# Patient Record
Sex: Female | Born: 1995 | Race: White | Hispanic: No | Marital: Single | Smoking: Never smoker
Health system: Southern US, Community
[De-identification: ages and names within clinical notes are randomized; demographics above are authoritative.]

## PROBLEM LIST (undated history)

## (undated) DIAGNOSIS — J45909 Unspecified asthma, uncomplicated: Secondary | ICD-10-CM

## (undated) DIAGNOSIS — L509 Urticaria, unspecified: Secondary | ICD-10-CM

## (undated) DIAGNOSIS — G935 Compression of brain: Secondary | ICD-10-CM

## (undated) HISTORY — DX: Urticaria, unspecified: L50.9

## (undated) HISTORY — PX: NO PAST SURGERIES: SHX2092

---

## 2019-05-06 ENCOUNTER — Other Ambulatory Visit: Payer: Self-pay

## 2019-05-06 ENCOUNTER — Ambulatory Visit (HOSPITAL_COMMUNITY)
Admission: EM | Admit: 2019-05-06 | Discharge: 2019-05-06 | Disposition: A | Discharge status: Home / Self Care | Payer: Self-pay | Attending: Family Medicine | Admitting: Family Medicine

## 2019-05-06 ENCOUNTER — Telehealth (HOSPITAL_COMMUNITY): Payer: Self-pay | Admitting: Emergency Medicine

## 2019-05-06 ENCOUNTER — Encounter (HOSPITAL_COMMUNITY): Payer: Self-pay | Admitting: Family Medicine

## 2019-05-06 DIAGNOSIS — R531 Weakness: Secondary | ICD-10-CM | POA: Insufficient documentation

## 2019-05-06 DIAGNOSIS — R5383 Other fatigue: Secondary | ICD-10-CM

## 2019-05-06 DIAGNOSIS — M791 Myalgia, unspecified site: Secondary | ICD-10-CM | POA: Insufficient documentation

## 2019-05-06 LAB — COMPREHENSIVE METABOLIC PANEL
ALT: 23 U/L (ref 0–44)
AST: 31 U/L (ref 15–41)
Albumin: 4.1 g/dL (ref 3.5–5.0)
Alkaline Phosphatase: 60 U/L (ref 38–126)
Anion gap: 10 (ref 5–15)
BUN: 12 mg/dL (ref 6–20)
CO2: 27 mmol/L (ref 22–32)
Calcium: 9.7 mg/dL (ref 8.9–10.3)
Chloride: 104 mmol/L (ref 98–111)
Creatinine, Ser: 0.74 mg/dL (ref 0.44–1.00)
GFR calc Af Amer: >60 mL/min (ref >60)
GFR calc non Af Amer: >60 mL/min (ref >60)
Glucose, Bld: 83 mg/dL (ref 70–99)
Potassium: 3.7 mmol/L (ref 3.5–5.1)
Sodium: 141 mmol/L (ref 135–145)
Total Bilirubin: 0.3 mg/dL (ref 0.3–1.2)
Total Protein: 6.9 g/dL (ref 6.5–8.1)

## 2019-05-06 LAB — CBC WITH DIFFERENTIAL/PLATELET
Abs Immature Granulocytes: 0.01 10*3/uL (ref 0.00–0.07)
Basophils Absolute: 0 10*3/uL (ref 0.0–0.1)
Basophils Relative: 1 %
Eosinophils Absolute: 0.1 10*3/uL (ref 0.0–0.5)
Eosinophils Relative: 2 %
HCT: 40.5 % (ref 36.0–46.0)
Hemoglobin: 13.2 g/dL (ref 12.0–15.0)
Immature Granulocytes: 0 %
Lymphocytes Relative: 36 %
Lymphs Abs: 1.7 10*3/uL (ref 0.7–4.0)
MCH: 27 pg (ref 26.0–34.0)
MCHC: 32.6 g/dL (ref 30.0–36.0)
MCV: 83 fL (ref 80.0–100.0)
Monocytes Absolute: 0.5 10*3/uL (ref 0.1–1.0)
Monocytes Relative: 10 %
Neutro Abs: 2.4 10*3/uL (ref 1.7–7.7)
Neutrophils Relative %: 51 %
Platelets: 301 10*3/uL (ref 150–400)
RBC: 4.88 MIL/uL (ref 3.87–5.11)
RDW: 13 % (ref 11.5–15.5)
WBC: 4.8 10*3/uL (ref 4.0–10.5)
nRBC: 0 % (ref 0.0–0.2)

## 2019-05-06 MED ORDER — AMOXICILLIN 875 MG PO TABS: 875.0000 mg | ORAL_TABLET | Freq: Two times a day (BID) | ORAL | 0 refills | Status: DC

## 2019-05-06 NOTE — ED Provider Notes (Addendum)
[place]    CSN: [id number] Arrival date & time: 05/06/19  1300      History   Chief Complaint Chief Complaint  Patient presents with  . Weakness    HPI Kewana Sanon is a 23 y.o. female.   Initial [facility] visit for this 23 yo woman with fatigue:  Pt. States she has felt fatigue, pain all over for a week.  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  No fever or rash  Works in Radio broadcast assistant.     History reviewed. No pertinent past medical history.  There are no active problems to display for this patient.   History reviewed. No pertinent surgical history.  OB History   No obstetric history on file.      Home Medications    Prior to Admission medications   Medication Sig Start Date End Date Taking? Authorizing Provider  cyclobenzaprine (FLEXERIL) 10 MG tablet 10 mg. 04/08/19  Yes [provider]  DULoxetine (CYMBALTA) 60 MG capsule Take by mouth. 04/08/19  Yes [provider]  eletriptan (RELPAX) 40 MG tablet Take by mouth. 04/08/19  Yes [provider]  albuterol (VENTOLIN HFA) 108 (90 Base) MCG/ACT inhaler Inhale 108 puffs into the lungs as needed.    [provider]  amoxicillin (AMOXIL) 875 MG tablet Take 1 tablet (875 mg total) by mouth 2 (two) times daily. 05/06/19   Robyn Haber, MD  norethindrone-ethinyl estradiol (LOESTRIN FE) 1-20 MG-MCG tablet Take by mouth.    [provider]  norethindrone-ethinyl estradiol (LOESTRIN FE) 1-20 MG-MCG tablet Take by mouth.    [provider]    Family History Family History  Adopted: Yes  Problem Relation Age of Onset  . Healthy Mother   . Healthy Father     Social History Social History   Tobacco Use  . Smoking status: Never Smoker  . Smokeless tobacco: Never Used  Substance Use Topics  . Alcohol use: Never    Frequency: Never  . Drug use: Never     Allergies   Venlafaxine,  Doxycycline, Gabapentin, Ondansetron hcl, and Topiramate   Review of Systems Review of Systems  Constitutional: Positive for fatigue.  Gastrointestinal: Negative.   Musculoskeletal: Positive for myalgias.  Skin: Negative.   Neurological: Negative for headaches.     Physical Exam Triage Vital Signs ED Triage Vitals  Enc Vitals Group     BP 05/06/19 1322 117/76     Pulse Rate 05/06/19 1322 (!) 112     Resp 05/06/19 1322 16     Temp 05/06/19 1322 98.1 F (36.7 C)     Temp Source 05/06/19 1322 Temporal     SpO2 05/06/19 1322 100 %     Weight 05/06/19 1326 106 lb (48.1 kg)     Height --      Head Circumference --      Peak Flow --      Pain Score 05/06/19 1326 5     Pain Loc --      Pain Edu? --      Excl. in [place]? --    No data found.  Updated Vital Signs BP 117/76 (BP Location: Left Arm)   Pulse (!) 112   Temp 98.1 F (36.7 C) (Temporal)   Resp 16   Wt 48.1 kg   SpO2 100%    Physical Exam Vitals signs and nursing note reviewed.  Constitutional:  Appearance: Normal appearance. She is normal weight.  HENT:     Head: Normocephalic and atraumatic.     Nose:     Comments: Peeling skin over nose    Mouth/Throat:     Mouth: Mucous membranes are moist.     Pharynx: Oropharynx is clear.  Eyes:     Conjunctiva/sclera: Conjunctivae normal.     Pupils: Pupils are equal, round, and reactive to light.  Neck:     Musculoskeletal: Normal range of motion and neck supple.  Cardiovascular:     Rate and Rhythm: Normal rate.     Heart sounds: Normal heart sounds.  Pulmonary:     Effort: Pulmonary effort is normal.     Breath sounds: Normal breath sounds.  Abdominal:     General: Abdomen is flat.     Palpations: Abdomen is soft. There is no mass.     Tenderness: There is no abdominal tenderness.  Musculoskeletal: Normal range of motion.  Skin:    General: Skin is warm.     Findings: Rash present.  Neurological:     General: No focal deficit present.     Mental  Status: She is alert.  Psychiatric:        Mood and Affect: Mood normal.        Behavior: Behavior normal.      UC Treatments / Results  Labs (all labs ordered are listed, but only abnormal results are displayed) Labs Reviewed  CBC WITH DIFFERENTIAL/PLATELET  COMPREHENSIVE METABOLIC PANEL  ROCKY MTN SPOTTED FVR ABS PNL(IGG+IGM)  B. BURGDORFI ANTIBODIES    EKG   Radiology No results found.  Procedures Procedures (including critical care time)  Medications Ordered in UC Medications - No data to display  Initial Impression / Assessment and Plan / UC Course  I have reviewed the triage vital signs and the nursing notes.  Pertinent labs & imaging results that were available during my care of the patient were reviewed by me and considered in my medical decision making (see chart for details).    Final Clinical Impressions(s) / UC Diagnoses   Final diagnoses:  Weakness  Myalgia   Discharge Instructions   None    ED Prescriptions    Medication Sig Dispense Auth. Provider   amoxicillin (AMOXIL) 875 MG tablet Take 1 tablet (875 mg total) by mouth 2 (two) times daily. 20 tablet Elvina Sidle, MD     PDMP not reviewed this encounter.   Elvina Sidle, MD 05/06/19 1346    Elvina Sidle, MD 05/06/19 1346

## 2019-05-06 NOTE — Telephone Encounter (Signed)
Labs unremarkable, pending lyme and rmsf test. Attempted to reach patient. No answer at this time

## 2019-05-06 NOTE — ED Triage Notes (Signed)
Pt. States she has felt fatigue, pain all over for a week.

## 2019-05-09 LAB — B. BURGDORFI ANTIBODIES: B burgdorferi Ab IgG+IgM: <0.91 {ISR} (ref 0.00–0.90)

## 2019-05-10 ENCOUNTER — Telehealth (HOSPITAL_COMMUNITY): Payer: Self-pay | Admitting: Emergency Medicine

## 2019-05-10 LAB — ROCKY MTN SPOTTED FVR ABS PNL(IGG+IGM)
RMSF IgG: NEGATIVE
RMSF IgM: 0.42 index (ref 0.00–0.89)

## 2019-05-10 NOTE — Telephone Encounter (Signed)
Patient contacted and made aware of blood test   results, all questions answered If still symptomatic, pt will need to follow up with PCP.

## 2019-06-01 ENCOUNTER — Ambulatory Visit
Admission: EM | Admit: 2019-06-01 | Discharge: 2019-06-01 | Disposition: A | Discharge status: Home / Self Care | Payer: Medicaid Other | Attending: Physician Assistant | Admitting: Physician Assistant

## 2019-06-01 DIAGNOSIS — G43019 Migraine without aura, intractable, without status migrainosus: Secondary | ICD-10-CM

## 2019-06-01 HISTORY — DX: Compression of brain: G93.5

## 2019-06-01 MED ORDER — METOCLOPRAMIDE HCL 5 MG/ML IJ SOLN
5.0000 mg | Freq: Once | INTRAMUSCULAR | Status: AC
  Administered 2019-06-01: 5 mg via INTRAMUSCULAR

## 2019-06-01 MED ORDER — KETOROLAC TROMETHAMINE 30 MG/ML IJ SOLN
30.0000 mg | Freq: Once | INTRAMUSCULAR | Status: AC
  Administered 2019-06-01: 30 mg via INTRAMUSCULAR

## 2019-06-01 MED ORDER — DEXAMETHASONE SODIUM PHOSPHATE 10 MG/ML IJ SOLN
10.0000 mg | Freq: Once | INTRAMUSCULAR | Status: AC
  Administered 2019-06-01: 10 mg via INTRAMUSCULAR

## 2019-06-01 NOTE — ED Triage Notes (Signed)
Pt presents with complaints of a migraine that started yesterday. The pain is on the right side of her head that is behind her eye and radiating down the back, the pain is stabbing in nature. The patient has a history of migraines and states this does not feel the same and is the worst one she has ever had. Amy PA notified of patients complaint.

## 2019-06-01 NOTE — Discharge Instructions (Addendum)
No alarming signs on exam.  Toradol, Reglan, Decadron injection in office today.  As discussed, given your cold symptoms are normal for you, will continue to monitor.  However, if no improvement, may need Covid testing.  Please contact neurology for follow-up if symptoms not improving.  If significantly worsening, nausea/vomiting, dizziness, weakness, passing out, go to the emergency department for further evaluation.

## 2019-06-01 NOTE — ED Provider Notes (Signed)
[facility]    CSN: [id number] Arrival date & time: 06/01/19  1239      History   Chief Complaint Chief Complaint  Patient presents with  . Headache    HPI Tamara Clark is a 23 y.o. female.   23 year old female with history of arnold-chiari malformation comes in for 2 day history of right sided headache.  Patient states she gets migraines, and symptoms are similar, however, medications that she usually uses is not helping.  Her migraine is right-sided, stabbing in sensation, with pain behind the eye and radiating down the back.  She denies nausea, vomiting.  Has had photophobia without phonophobia.  Denies vision changes.  Denies weakness, dizziness, syncope.  She has prescription medication for migraine, but cannot recall the name, took 1 without relief.   Patient had head injury 1 month ago, where metal chain hit her head.  Denies loss of consciousness.  She had a few day history of headache, then symptoms completely resolved.  Denies any complications from that episode.  Patient has baseline cough, and baseline body aches from fibromyalgia.  Denies any worsening.  Denies URI symptoms such as rhinorrhea, nasal congestion.  Denies fever, chills.  Denies abdominal pain, diarrhea.  Denies shortness of breath, loss of taste or smell.  No known sick/Covid contact.  Patient follows with neurosurgeon, neurologist routinely.  Denies any complications from Chiari malformation.  Followed up with neurosurgeon last month, and neurologist 03/2019.     Past Medical History:  Diagnosis Date  . 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   History reviewed. No pertinent surgical history.  OB History   No obstetric history on file.      Home Medications    Prior to Admission medications   Medication Sig Start Date End Date Taking? Authorizing Provider  albuterol (VENTOLIN HFA) 108 (90 Base) MCG/ACT inhaler  Inhale 108 puffs into the lungs as needed.   Yes [provider]  cyclobenzaprine (FLEXERIL) 10 MG tablet 10 mg. 04/08/19  Yes [provider]  DULoxetine (CYMBALTA) 60 MG capsule Take by mouth. 04/08/19  Yes [provider]  eletriptan (RELPAX) 40 MG tablet Take by mouth. 04/08/19  Yes [provider]  norethindrone-ethinyl estradiol (LOESTRIN FE) 1-20 MG-MCG tablet Take by mouth.   Yes [provider]    Family History Family History  Adopted: Yes  Problem Relation Age of Onset  . Healthy Mother   . Healthy Father     Social History Social History   Tobacco Use  . Smoking status: Never Smoker  . Smokeless tobacco: Never Used  Substance Use Topics  . Alcohol use: Never    Frequency: Never  . Drug use: Never     Allergies   Venlafaxine, Doxycycline, Gabapentin, Ondansetron hcl, and Topiramate   Review of Systems Review of Systems  Reason unable to perform ROS: See HPI as above.     Physical Exam Triage Vital Signs ED Triage Vitals  Enc Vitals Group     BP 06/01/19 1253 117/77     Pulse Rate 06/01/19 1253 85     Resp 06/01/19 1253 16     Temp 06/01/19 1253 98.1 F (36.7 C)     Temp Source 06/01/19 1253 Oral     SpO2 06/01/19 1253 99 %     Weight --      Height --      Head Circumference --  Peak Flow --      Pain Score 06/01/19 1257 9     Pain Loc --      Pain Edu? --      Excl. in [place]? --    No data found.  Updated Vital Signs BP 117/77 (BP Location: Left Arm)   Pulse 85   Temp 98.1 F (36.7 C) (Oral)   Resp 16   LMP 06/01/2019   SpO2 99%   Physical Exam Constitutional:      General: She is not in acute distress.    Appearance: Normal appearance. She is not ill-appearing, toxic-appearing or diaphoretic.  HENT:     Head: Normocephalic and atraumatic.     Right Ear: Tympanic membrane, ear canal and external ear normal. Tympanic membrane is not erythematous or bulging.     Left Ear: Tympanic  membrane, ear canal and external ear normal. Tympanic membrane is not erythematous or bulging.     Nose:     Right Sinus: No maxillary sinus tenderness or frontal sinus tenderness.     Left Sinus: No maxillary sinus tenderness or frontal sinus tenderness.     Mouth/Throat:     Mouth: Mucous membranes are moist.     Pharynx: Oropharynx is clear. Uvula midline.  Eyes:     Extraocular Movements: Extraocular movements intact.     Conjunctiva/sclera: Conjunctivae normal.     Pupils: Pupils are equal, round, and reactive to light.  Neck:     Musculoskeletal: Normal range of motion and neck supple. No pain with movement, spinous process tenderness or muscular tenderness.  Cardiovascular:     Rate and Rhythm: Normal rate and regular rhythm.     Heart sounds: Normal heart sounds. No murmur. No friction rub. No gallop.   Pulmonary:     Effort: Pulmonary effort is normal. No accessory muscle usage, prolonged expiration, respiratory distress or retractions.     Comments: Lungs clear to auscultation without adventitious lung sounds. Skin:    General: Skin is warm and dry.  Neurological:     General: No focal deficit present.     Mental Status: She is alert and oriented to person, place, and time.     GCS: GCS eye subscore is 4. GCS verbal subscore is 5. GCS motor subscore is 6.     Cranial Nerves: Cranial nerves are intact.     Sensory: Sensation is intact.     Motor: Motor function is intact.     Coordination: Coordination is intact.     Gait: Gait is intact.     Comments: Able to ambulate on own without difficulty.       UC Treatments / Results  Labs (all labs ordered are listed, but only abnormal results are displayed) Labs Reviewed - No data to display  EKG   Radiology No results found.  Procedures Procedures (including critical care time)  Medications Ordered in UC Medications  metoCLOPramide (REGLAN) injection 5 mg (5 mg Intramuscular Given 06/01/19 1347)  dexamethasone  (DECADRON) injection 10 mg (10 mg Intramuscular Given 06/01/19 1347)  ketorolac (TORADOL) 30 MG/ML injection 30 mg (30 mg Intramuscular Given 06/01/19 1347)    Initial Impression / Assessment and Plan / UC Course  I have reviewed the triage vital signs and the nursing notes.  Pertinent labs & imaging results that were available during my care of the patient were reviewed by me and considered in my medical decision making (see chart for details).    No alarming signs on  exam.  Will provide migraine treatment with Toradol, Reglan, Decadron.  Patient to follow-up with neurology for reevaluation if symptoms not improving.  Also discussed with patient, if worsening URI symptoms, may need Covid testing.  Strict return precautions given.  Patient expresses understanding and agrees to plan.  Final Clinical Impressions(s) / UC Diagnoses   Final diagnoses:  Intractable migraine without aura and without status migrainosus   ED Prescriptions    None     I have reviewed the PDMP during this encounter.   Belinda FisherYu, Percival Glasheen V, PA-C 06/01/19 1425

## 2020-01-02 ENCOUNTER — Other Ambulatory Visit: Payer: Self-pay | Admitting: Neurological Surgery

## 2020-02-14 ENCOUNTER — Other Ambulatory Visit: Payer: Self-pay | Admitting: Neurological Surgery

## 2020-02-14 DIAGNOSIS — G935 Compression of brain: Secondary | ICD-10-CM

## 2020-03-13 ENCOUNTER — Ambulatory Visit
Admission: RE | Admit: 2020-03-13 | Discharge: 2020-03-13 | Disposition: A | Discharge status: Home / Self Care | Payer: BC Managed Care – PPO | Source: Ambulatory Visit | Attending: Neurological Surgery | Admitting: Neurological Surgery

## 2020-03-13 ENCOUNTER — Other Ambulatory Visit: Payer: Self-pay

## 2020-03-13 DIAGNOSIS — G935 Compression of brain: Secondary | ICD-10-CM

## 2020-03-13 IMAGING — MR MR HEAD W/O CM
10 series · 48 of 48 positions shown · non-contrast
Comparison: [DATE] MRI head and prior.

CLINICAL DATA: Chiari 1 malformation.

EXAM:
MRI HEAD WITHOUT CONTRAST
TECHNIQUE: Multiplanar, multiecho pulse sequences of the brain and surrounding
structures were obtained without intravenous contrast.

[Series 5: T1 · sagittal · 4.0mm · 0.75mm/px · 2 of 28 slices shown (1 of 2)]
[im 1/28]
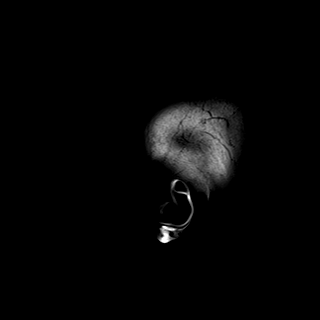
[im 28/28]
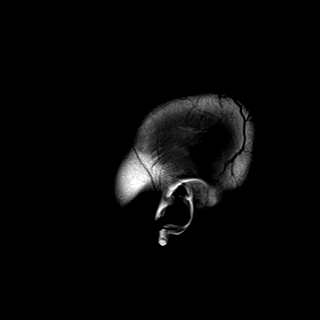

[Series 6: DWI · axial · 3.0mm · 1.44mm/px · z∈[-85,+90]mm · 8 of 108 slices shown (1 of 4)]
[im 1/108]
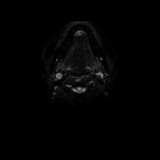
[im 16/108]
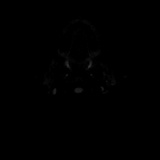
[im 31/108]
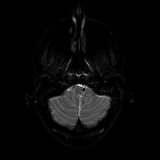
[im 46/108]
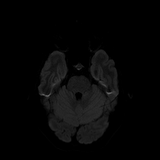
[im 62/108]
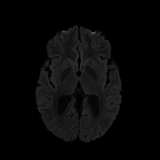
[im 77/108]
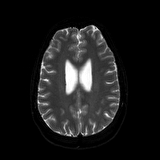
[im 92/108]
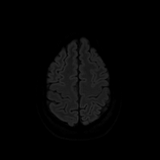
[im 108/108]
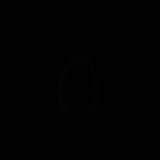

[Series 7: DWI · axial · 3.0mm · 1.44mm/px · z∈[-85,+90]mm · 4 of 54 slices shown (2 of 4)]
[im 1/54]
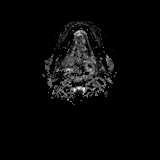
[im 18/54]
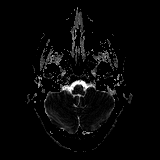
[im 36/54]
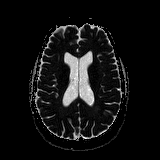
[im 54/54]
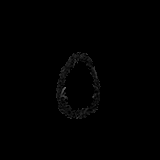

[Series 8: DWI · coronal · 5.0mm · 1.44mm/px · 5 of 66 slices shown (3 of 4)]
[im 1/66]
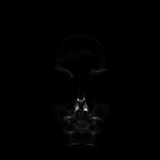
[im 17/66]
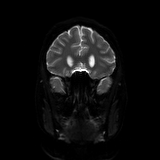
[im 33/66]
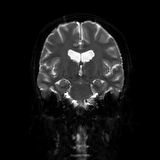
[im 49/66]
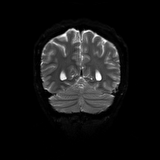
[im 66/66]
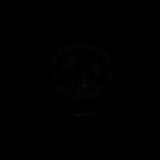

[Series 9: DWI · coronal · 5.0mm · 1.44mm/px · 2 of 33 slices shown (4 of 4)]
[im 1/33]
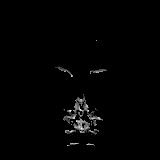
[im 33/33]
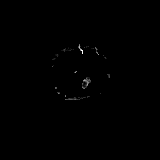

[Series 10: T2 · axial · 4.0mm · 0.36mm/px · z∈[-86,+90]mm · 3 of 35 slices shown (1 of 2)]
[im 1/35]
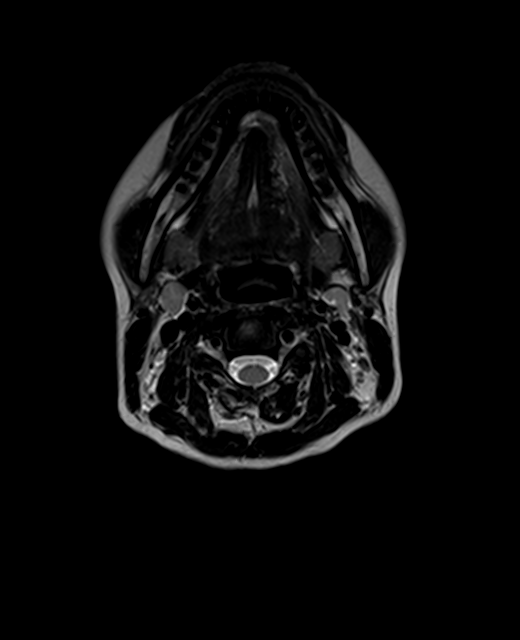
[im 18/35]
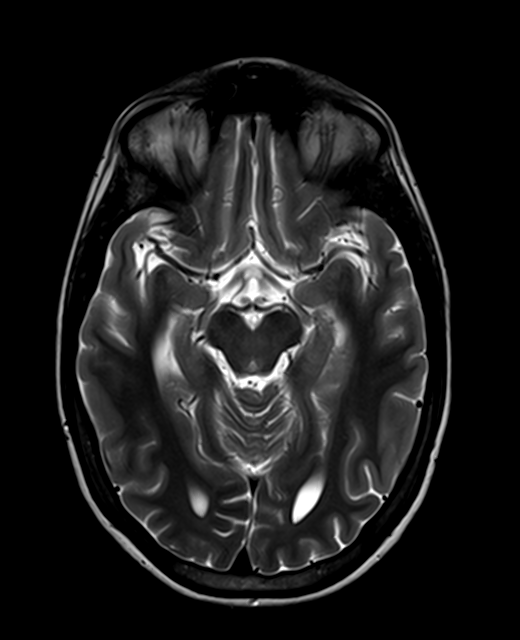
[im 35/35]
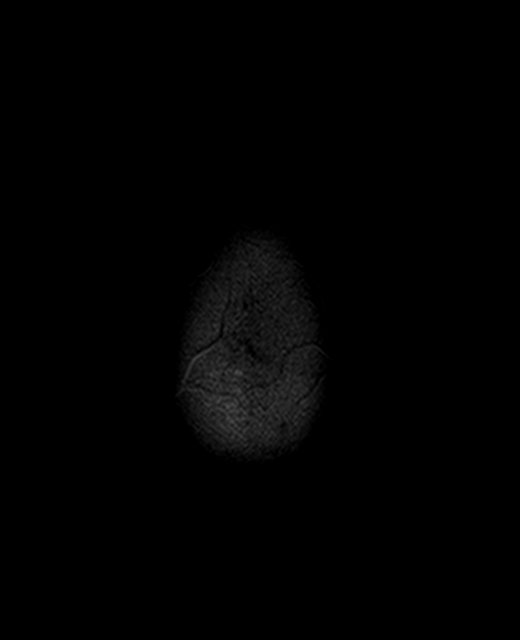

[Series 11: FLAIR · axial · 3.0mm · 0.72mm/px · z∈[-72,+77]mm · 2 of 26 slices shown]
[im 1/26]
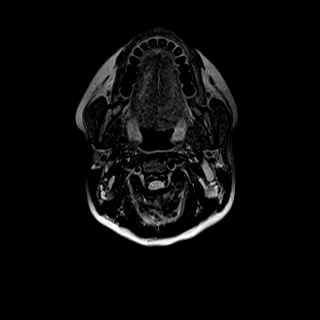
[im 26/26]
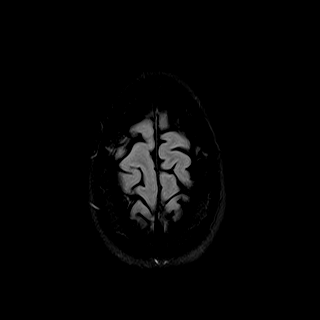

[Series 13: swi_images · axial · 1.5mm · 0.90mm/px · z∈[-69,+73]mm · 7 of 96 slices shown]
[im 1/96]
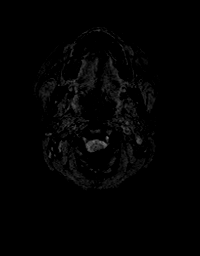
[im 16/96]
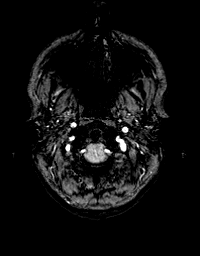
[im 32/96]
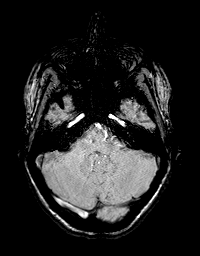
[im 48/96]
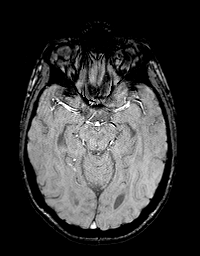
[im 64/96]
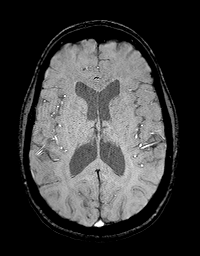
[im 80/96]
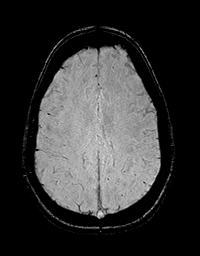
[im 96/96]
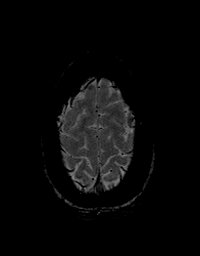

[Series 14: T1 · axial · 1.0mm · 0.94mm/px · z∈[-95,+77]mm · 13 of 176 slices shown (2 of 2)]
[im 1/176]
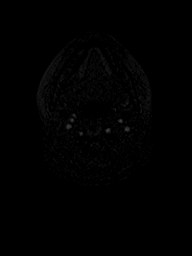
[im 15/176]
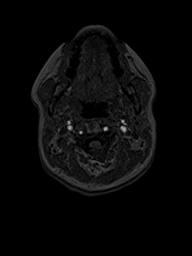
[im 30/176]
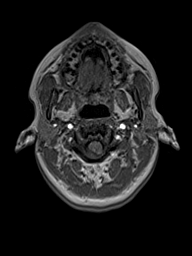
[im 44/176]
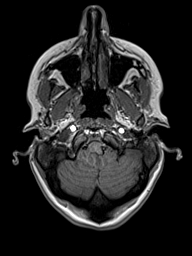
[im 59/176]
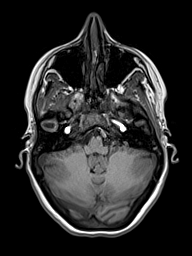
[im 73/176]
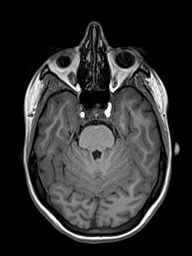
[im 88/176]
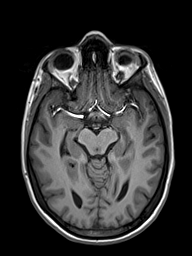
[im 103/176]
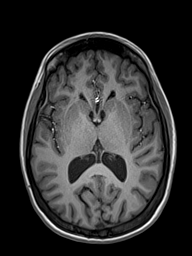
[im 117/176]
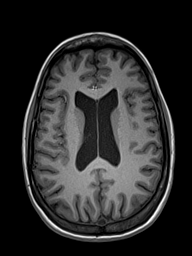
[im 132/176]
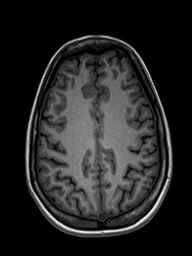
[im 146/176]
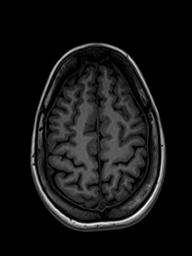
[im 161/176]
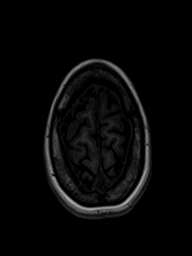
[im 176/176]
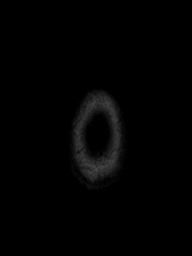

[Series 15: T2 · coronal · 4.5mm · 0.36mm/px · 2 of 33 slices shown (2 of 2)]
[im 1/33]
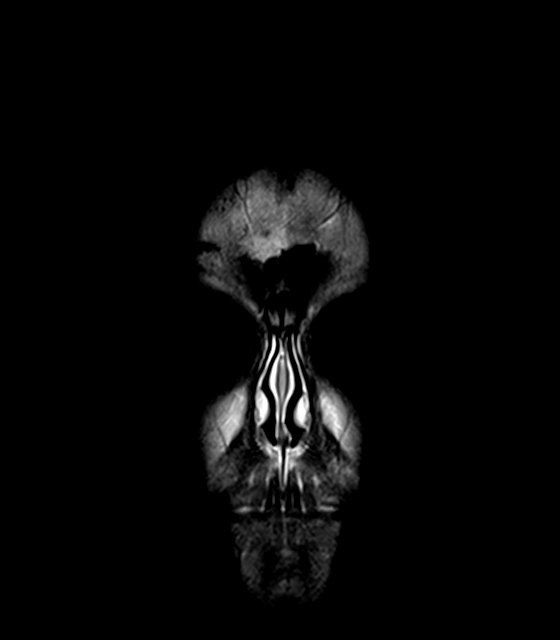
[im 33/33]
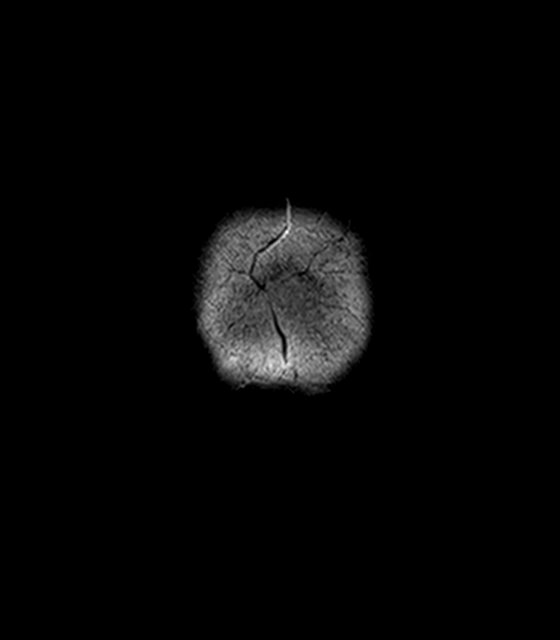

[48 of 48 positions shown; findings below may reference images not displayed]

FINDINGS: Brain: Cerebellar tonsillar descent of approximately 1.1 cm below
the foramen magnum with crowding of the craniocervical junction
([DATE]) is unchanged. Partially effaced fourth ventricle. Mild
lateral and third ventricular dilatation is grossly unchanged. For
reference the ventricles measure up to 5.5 cm at the level of the
atria ([DATE], previously 5.4 cm when re-measured). Narrowing of the
cerebral aqueduct on sagittal images.

No acute infarct or intracranial hemorrhage. No midline shift or
extra-axial fluid collection. No mass lesion.

Vascular: Normal flow voids.

Skull and upper cervical spine: Normal marrow signal.

Sinuses/Orbits: Normal orbits. Clear paranasal sinuses. Small left
and trace right mastoid effusions.

Other: None.
IMPRESSION: No acute intracranial process.

Stable appearance of mild lateral and third ventricular dilatation.

Stable appearance of Chiari 1 malformation with cerebellar tonsillar
descent of 1.1 cm and craniocervical junction crowding.

## 2020-03-22 NOTE — Pre-Procedure Instructions (Signed)
[id number] [place], [place] - [address] AT [facility] [address] [place] [address] Phone: [phone] Fax: [phone]     Your procedure is scheduled on Tuesday, August 31, from 07:30 AM- 10:33 AM.  Report to [facility] Main Entrance "A" at 05:30 A.M., and check in at the Admitting office.  Call this number if you have problems the morning of surgery:  [phone]  Call [phone] if you have any questions prior to your surgery date Monday-Friday 8am-4pm.    Remember:  Do not eat or drink after midnight the night before your surgery.     Take these medicines the morning of surgery with A SIP OF WATER: topiramate (TOPAMAX) Vibegron (GEMTESA)   IF NEEDED:  eletriptan (RELPAX)  albuterol (VENTOLIN HFA) inhaler- bring with you the day of surgery.    As of today, STOP taking any Aspirin (unless otherwise instructed by your surgeon) Aleve, Naproxen, Ibuprofen, Motrin, Advil, Goody's, BC's, all herbal medications, fish oil, and all vitamins.          The Morning of Surgery:            Do not wear jewelry, make up, or nail polish.            Do not wear lotions, powders, perfumes, or deodorant.            Do not shave 48 hours prior to surgery.              Do not bring valuables to the hospital.            [facility] is not responsible for any belongings or valuables.  Do NOT Smoke (Tobacco/Vaping) or drink Alcohol 24 hours prior to your procedure.  If you use a CPAP at night, you may bring all equipment for your overnight stay.   Contacts, glasses, dentures or bridgework may not be worn into surgery.      For patients admitted to the hospital, discharge time will be determined by your treatment team.   Patients discharged the day of surgery will not be allowed to drive home, and someone needs to stay with them for 24 hours.    Special instructions:   [facility]- Preparing For Surgery  Before surgery, you  can play an important role. Because skin is not sterile, your skin needs to be as free of germs as possible. You can reduce the number of germs on your skin by washing with CHG (chlorahexidine gluconate) Soap before surgery.  CHG is an antiseptic cleaner which kills germs and bonds with the skin to continue killing germs even after washing.    Oral Hygiene is also important to reduce your risk of infection.  Remember - BRUSH YOUR TEETH THE MORNING OF SURGERY WITH YOUR REGULAR TOOTHPASTE  Please do not use if you have an allergy to CHG or antibacterial soaps. If your skin becomes reddened/irritated stop using the CHG.  Do not shave (including legs and underarms) for at least 48 hours prior to first CHG shower. It is OK to shave your face.  Please follow these instructions carefully.   1. Shower the NIGHT BEFORE SURGERY and the MORNING OF SURGERY with CHG Soap.   2. If you chose to wash your hair, wash your hair first as usual with your normal shampoo.  3. After you shampoo, rinse your hair and body thoroughly to remove the shampoo.  4. Use CHG as you would any other  liquid soap. You can apply CHG directly to the skin and wash gently with a scrungie or a clean washcloth.   5. Apply the CHG Soap to your body ONLY FROM THE NECK DOWN.  Do not use on open wounds or open sores. Avoid contact with your eyes, ears, mouth and genitals (private parts). Wash Face and genitals (private parts)  with your normal soap.   6. Wash thoroughly, paying special attention to the area where your surgery will be performed.  7. Thoroughly rinse your body with warm water from the neck down.  8. DO NOT shower/wash with your normal soap after using and rinsing off the CHG Soap.  9. Pat yourself dry with a CLEAN TOWEL.  10. Wear CLEAN PAJAMAS to bed the night before surgery  11. Place CLEAN SHEETS on your bed the night of your first shower and DO NOT SLEEP WITH PETS.   Day of Surgery: Wear Clean/Comfortable  clothing the morning of surgery. Do not apply any deodorants/lotions.   Remember to brush your teeth WITH YOUR REGULAR TOOTHPASTE.   Please read over the following fact sheets that you were given.

## 2020-03-23 ENCOUNTER — Other Ambulatory Visit: Payer: Self-pay

## 2020-03-23 ENCOUNTER — Other Ambulatory Visit (HOSPITAL_COMMUNITY)
Admission: RE | Admit: 2020-03-23 | Discharge: 2020-03-23 | Disposition: A | Discharge status: Home / Self Care | Payer: BC Managed Care – PPO | Source: Ambulatory Visit | Attending: Neurological Surgery | Admitting: Neurological Surgery

## 2020-03-23 ENCOUNTER — Encounter (HOSPITAL_COMMUNITY): Payer: Self-pay

## 2020-03-23 ENCOUNTER — Encounter (HOSPITAL_COMMUNITY)
Admission: RE | Admit: 2020-03-23 | Discharge: 2020-03-23 | Disposition: A | Discharge status: Home / Self Care | Payer: BC Managed Care – PPO | Source: Ambulatory Visit | Attending: Neurological Surgery | Admitting: Neurological Surgery

## 2020-03-23 DIAGNOSIS — Z20822 Contact with and (suspected) exposure to covid-19: Secondary | ICD-10-CM | POA: Insufficient documentation

## 2020-03-23 DIAGNOSIS — Z01812 Encounter for preprocedural laboratory examination: Secondary | ICD-10-CM | POA: Insufficient documentation

## 2020-03-23 HISTORY — DX: Unspecified asthma, uncomplicated: J45.909

## 2020-03-23 LAB — BASIC METABOLIC PANEL
Anion gap: 11 (ref 5–15)
BUN: 18 mg/dL (ref 6–20)
CO2: 20 mmol/L — ABNORMAL LOW (ref 22–32)
Calcium: 9.3 mg/dL (ref 8.9–10.3)
Chloride: 107 mmol/L (ref 98–111)
Creatinine, Ser: 0.82 mg/dL (ref 0.44–1.00)
GFR calc Af Amer: >60 mL/min (ref >60)
GFR calc non Af Amer: >60 mL/min (ref >60)
Glucose, Bld: 92 mg/dL (ref 70–99)
Potassium: 3.8 mmol/L (ref 3.5–5.1)
Sodium: 138 mmol/L (ref 135–145)

## 2020-03-23 LAB — CBC
HCT: 40.4 % (ref 36.0–46.0)
Hemoglobin: 12.8 g/dL (ref 12.0–15.0)
MCH: 26.7 pg (ref 26.0–34.0)
MCHC: 31.7 g/dL (ref 30.0–36.0)
MCV: 84.2 fL (ref 80.0–100.0)
Platelets: 338 10*3/uL (ref 150–400)
RBC: 4.8 MIL/uL (ref 3.87–5.11)
RDW: 13.8 % (ref 11.5–15.5)
WBC: 4.5 10*3/uL (ref 4.0–10.5)
nRBC: 0 % (ref 0.0–0.2)

## 2020-03-23 LAB — SARS CORONAVIRUS 2 (TAT 6-24 HRS): SARS Coronavirus 2: NEGATIVE

## 2020-03-23 LAB — TYPE AND SCREEN
ABO/RH(D): A POS
Antibody Screen: NEGATIVE

## 2020-03-23 NOTE — Progress Notes (Signed)
PCP:  Synetta Fail, MD Cardiologist:  Denies  EKG:  09/18/19 in Care Everywhere.  Tracing requested from [facility]. CXR: 03/06/20 in Care Everywhere ECHO:  Denies Stress Test: Denies Cardiac Cath:  Denies  Covid test 03/23/20  Patient denies shortness of breath, fever, cough, and chest pain at PAT appointment.  Patient verbalized understanding of instructions provided today at the PAT appointment.  Patient asked to review instructions at home and day of surgery.

## 2020-03-27 ENCOUNTER — Inpatient Hospital Stay (HOSPITAL_COMMUNITY)
Admission: RE | Admit: 2020-03-27 | Discharge: 2020-03-29 | DRG: 027 | Disposition: A | Discharge status: Home / Self Care | Payer: BC Managed Care – PPO | Attending: Neurological Surgery | Admitting: Neurological Surgery

## 2020-03-27 ENCOUNTER — Encounter (HOSPITAL_COMMUNITY)
Admission: RE | Disposition: A | Discharge status: Home / Self Care | Payer: Self-pay | Source: Home / Self Care | Attending: Neurological Surgery

## 2020-03-27 ENCOUNTER — Inpatient Hospital Stay (HOSPITAL_COMMUNITY): Payer: BC Managed Care – PPO | Admitting: Certified Registered"

## 2020-03-27 ENCOUNTER — Other Ambulatory Visit: Payer: Self-pay

## 2020-03-27 ENCOUNTER — Encounter (HOSPITAL_COMMUNITY): Payer: Self-pay | Admitting: Neurological Surgery

## 2020-03-27 DIAGNOSIS — Z20822 Contact with and (suspected) exposure to covid-19: Secondary | ICD-10-CM | POA: Diagnosis present

## 2020-03-27 DIAGNOSIS — Q0700 Arnold-Chiari syndrome without spina bifida or hydrocephalus: Principal | ICD-10-CM

## 2020-03-27 DIAGNOSIS — G935 Compression of brain: Secondary | ICD-10-CM | POA: Diagnosis present

## 2020-03-27 DIAGNOSIS — J45909 Unspecified asthma, uncomplicated: Secondary | ICD-10-CM | POA: Diagnosis present

## 2020-03-27 HISTORY — PX: SUBOCCIPITAL CRANIECTOMY CERVICAL LAMINECTOMY: SHX5404

## 2020-03-27 LAB — ABO/RH: ABO/RH(D): A POS

## 2020-03-27 LAB — MRSA PCR SCREENING: MRSA by PCR: NEGATIVE

## 2020-03-27 LAB — POCT PREGNANCY, URINE: Preg Test, Ur: NEGATIVE

## 2020-03-27 SURGERY — SUBOCCIPITAL CRANIECTOMY CERVICAL LAMINECTOMY/DURAPLASTY
Anesthesia: General | Site: Head

## 2020-03-27 MED ORDER — OXYCODONE HCL 5 MG/5ML PO SOLN: 5.0000 mg | Freq: Once | ORAL | Status: AC | PRN

## 2020-03-27 MED ORDER — PROPOFOL 1000 MG/100ML IV EMUL
INTRAVENOUS | Status: AC
  Filled 2020-03-27: qty 300

## 2020-03-27 MED ORDER — PHENYLEPHRINE HCL-NACL 10-0.9 MG/250ML-% IV SOLN
INTRAVENOUS | Status: AC
  Filled 2020-03-27: qty 500

## 2020-03-27 MED ORDER — OXYCODONE HCL 5 MG PO TABS
ORAL_TABLET | ORAL | Status: AC
  Administered 2020-03-27: 5 mg via ORAL
  Filled 2020-03-27: qty 1

## 2020-03-27 MED ORDER — CHLORHEXIDINE GLUCONATE CLOTH 2 % EX PADS: 6.0000 | MEDICATED_PAD | Freq: Once | CUTANEOUS | Status: DC

## 2020-03-27 MED ORDER — HEMOSTATIC AGENTS (NO CHARGE) OPTIME
TOPICAL | Status: DC | PRN
  Administered 2020-03-27: 1 via TOPICAL

## 2020-03-27 MED ORDER — PROMETHAZINE HCL 25 MG PO TABS: 12.5000 mg | ORAL_TABLET | ORAL | Status: DC | PRN

## 2020-03-27 MED ORDER — FLEET ENEMA 7-19 GM/118ML RE ENEM: 1.0000 | ENEMA | Freq: Once | RECTAL | Status: DC | PRN

## 2020-03-27 MED ORDER — ACETAMINOPHEN 160 MG/5ML PO SOLN: 1000.0000 mg | Freq: Once | ORAL | Status: DC | PRN

## 2020-03-27 MED ORDER — PROPOFOL 10 MG/ML IV BOLUS
INTRAVENOUS | Status: AC
  Filled 2020-03-27: qty 40

## 2020-03-27 MED ORDER — HYDROMORPHONE HCL 1 MG/ML IJ SOLN
0.5000 mg | INTRAMUSCULAR | Status: DC | PRN
  Administered 2020-03-27 – 2020-03-28 (×4): 1 mg via INTRAVENOUS
  Filled 2020-03-27 (×4): qty 1

## 2020-03-27 MED ORDER — THROMBIN 5000 UNITS EX SOLR
OROMUCOSAL | Status: DC | PRN
  Administered 2020-03-27: 5 mL via TOPICAL

## 2020-03-27 MED ORDER — PROPOFOL 500 MG/50ML IV EMUL
INTRAVENOUS | Status: DC | PRN
  Administered 2020-03-27: 150 ug/kg/min via INTRAVENOUS

## 2020-03-27 MED ORDER — PANTOPRAZOLE SODIUM 40 MG IV SOLR
40.0000 mg | Freq: Two times a day (BID) | INTRAVENOUS | Status: DC
  Administered 2020-03-27 – 2020-03-28 (×2): 40 mg via INTRAVENOUS
  Filled 2020-03-27 (×2): qty 40

## 2020-03-27 MED ORDER — PROPOFOL 10 MG/ML IV BOLUS
INTRAVENOUS | Status: DC | PRN
  Administered 2020-03-27: 110 mg via INTRAVENOUS
  Administered 2020-03-27: 40 mg via INTRAVENOUS
  Administered 2020-03-27: 50 mg via INTRAVENOUS

## 2020-03-27 MED ORDER — SENNA 8.6 MG PO TABS
1.0000 | ORAL_TABLET | Freq: Two times a day (BID) | ORAL | Status: DC
  Administered 2020-03-28 – 2020-03-29 (×3): 8.6 mg via ORAL
  Filled 2020-03-27 (×3): qty 1

## 2020-03-27 MED ORDER — LACTATED RINGERS IV SOLN: INTRAVENOUS | Status: DC | PRN

## 2020-03-27 MED ORDER — FENTANYL CITRATE (PF) 100 MCG/2ML IJ SOLN
25.0000 ug | INTRAMUSCULAR | Status: DC | PRN
  Administered 2020-03-27: 50 ug via INTRAVENOUS

## 2020-03-27 MED ORDER — BUPIVACAINE HCL (PF) 0.25 % IJ SOLN
INTRAMUSCULAR | Status: AC
  Filled 2020-03-27: qty 30

## 2020-03-27 MED ORDER — ACETAMINOPHEN 10 MG/ML IV SOLN: 1000.0000 mg | Freq: Once | INTRAVENOUS | Status: DC | PRN

## 2020-03-27 MED ORDER — SUGAMMADEX SODIUM 200 MG/2ML IV SOLN
INTRAVENOUS | Status: DC | PRN
  Administered 2020-03-27: 100 mg via INTRAVENOUS

## 2020-03-27 MED ORDER — LABETALOL HCL 5 MG/ML IV SOLN: 10.0000 mg | INTRAVENOUS | Status: DC | PRN

## 2020-03-27 MED ORDER — 0.9 % SODIUM CHLORIDE (POUR BTL) OPTIME
TOPICAL | Status: DC | PRN
  Administered 2020-03-27 (×3): 1000 mL

## 2020-03-27 MED ORDER — ACETAMINOPHEN 10 MG/ML IV SOLN
INTRAVENOUS | Status: AC
  Administered 2020-03-27: 1000 mg via INTRAVENOUS
  Filled 2020-03-27: qty 100

## 2020-03-27 MED ORDER — LIDOCAINE 2% (20 MG/ML) 5 ML SYRINGE
INTRAMUSCULAR | Status: DC | PRN
  Administered 2020-03-27: 50 mg via INTRAVENOUS

## 2020-03-27 MED ORDER — PHENYLEPHRINE 40 MCG/ML (10ML) SYRINGE FOR IV PUSH (FOR BLOOD PRESSURE SUPPORT)
PREFILLED_SYRINGE | INTRAVENOUS | Status: DC | PRN
  Administered 2020-03-27: 120 ug via INTRAVENOUS

## 2020-03-27 MED ORDER — DOCUSATE SODIUM 100 MG PO CAPS
100.0000 mg | ORAL_CAPSULE | Freq: Two times a day (BID) | ORAL | Status: DC
  Administered 2020-03-28 – 2020-03-29 (×3): 100 mg via ORAL
  Filled 2020-03-27 (×3): qty 1

## 2020-03-27 MED ORDER — ACETAMINOPHEN 500 MG PO TABS: 1000.0000 mg | ORAL_TABLET | Freq: Once | ORAL | Status: DC | PRN

## 2020-03-27 MED ORDER — THROMBIN 5000 UNITS EX SOLR
CUTANEOUS | Status: AC
  Filled 2020-03-27: qty 15000

## 2020-03-27 MED ORDER — ROCURONIUM BROMIDE 10 MG/ML (PF) SYRINGE
PREFILLED_SYRINGE | INTRAVENOUS | Status: DC | PRN
  Administered 2020-03-27: 10 mg via INTRAVENOUS
  Administered 2020-03-27: 50 mg via INTRAVENOUS
  Administered 2020-03-27: 10 mg via INTRAVENOUS
  Administered 2020-03-27: 5 mg via INTRAVENOUS

## 2020-03-27 MED ORDER — MICROFIBRILLAR COLL HEMOSTAT EX PADS
MEDICATED_PAD | CUTANEOUS | Status: DC | PRN
  Administered 2020-03-27: 1 via TOPICAL

## 2020-03-27 MED ORDER — PHENYLEPHRINE HCL-NACL 10-0.9 MG/250ML-% IV SOLN
INTRAVENOUS | Status: DC | PRN
  Administered 2020-03-27: 25 ug/min via INTRAVENOUS

## 2020-03-27 MED ORDER — ORAL CARE MOUTH RINSE
15.0000 mL | Freq: Once | OROMUCOSAL | Status: AC
  Administered 2020-03-27: 15 mL via OROMUCOSAL

## 2020-03-27 MED ORDER — BACITRACIN ZINC 500 UNIT/GM EX OINT
TOPICAL_OINTMENT | CUTANEOUS | Status: AC
  Filled 2020-03-27: qty 28.35

## 2020-03-27 MED ORDER — PROCHLORPERAZINE MALEATE 10 MG PO TABS
10.0000 mg | ORAL_TABLET | Freq: Four times a day (QID) | ORAL | Status: DC | PRN
  Administered 2020-03-28: 10 mg via ORAL
  Filled 2020-03-27 (×2): qty 1

## 2020-03-27 MED ORDER — SODIUM CHLORIDE 0.9 % IV SOLN
INTRAVENOUS | Status: DC | PRN
  Administered 2020-03-27: 500 mL

## 2020-03-27 MED ORDER — PANTOPRAZOLE SODIUM 40 MG IV SOLR: 40.0000 mg | Freq: Every day | INTRAVENOUS | Status: DC

## 2020-03-27 MED ORDER — FENTANYL CITRATE (PF) 250 MCG/5ML IJ SOLN
INTRAMUSCULAR | Status: DC | PRN
Start: 2020-03-27 — End: 2020-03-27
  Administered 2020-03-27: 150 ug via INTRAVENOUS

## 2020-03-27 MED ORDER — FENTANYL CITRATE (PF) 100 MCG/2ML IJ SOLN
INTRAMUSCULAR | Status: AC
  Administered 2020-03-27: 50 ug via INTRAVENOUS
  Filled 2020-03-27: qty 2

## 2020-03-27 MED ORDER — OXYCODONE HCL 5 MG PO TABS: 5.0000 mg | ORAL_TABLET | Freq: Once | ORAL | Status: AC | PRN

## 2020-03-27 MED ORDER — LACTATED RINGERS IV SOLN: INTRAVENOUS | Status: DC

## 2020-03-27 MED ORDER — LIDOCAINE-EPINEPHRINE 1 %-1:100000 IJ SOLN
INTRAMUSCULAR | Status: DC | PRN
  Administered 2020-03-27: 5 mL

## 2020-03-27 MED ORDER — PANTOPRAZOLE SODIUM 40 MG PO TBEC: 80.0000 mg | DELAYED_RELEASE_TABLET | Freq: Every day | ORAL | Status: DC

## 2020-03-27 MED ORDER — ONDANSETRON HCL 4 MG/2ML IJ SOLN: 4.0000 mg | INTRAMUSCULAR | Status: DC | PRN

## 2020-03-27 MED ORDER — MIDAZOLAM HCL 2 MG/2ML IJ SOLN
INTRAMUSCULAR | Status: AC
  Filled 2020-03-27: qty 2

## 2020-03-27 MED ORDER — PROCHLORPERAZINE EDISYLATE 10 MG/2ML IJ SOLN
5.0000 mg | Freq: Four times a day (QID) | INTRAMUSCULAR | Status: DC | PRN
  Administered 2020-03-28 – 2020-03-29 (×3): 10 mg via INTRAVENOUS
  Filled 2020-03-27 (×3): qty 2

## 2020-03-27 MED ORDER — SUCCINYLCHOLINE CHLORIDE 200 MG/10ML IV SOSY
PREFILLED_SYRINGE | INTRAVENOUS | Status: AC
  Filled 2020-03-27: qty 10

## 2020-03-27 MED ORDER — HYDROCODONE-ACETAMINOPHEN 5-325 MG PO TABS
1.0000 | ORAL_TABLET | ORAL | Status: DC | PRN
  Administered 2020-03-27 – 2020-03-28 (×3): 1 via ORAL
  Filled 2020-03-27 (×3): qty 1

## 2020-03-27 MED ORDER — BACITRACIN ZINC 500 UNIT/GM EX OINT
TOPICAL_OINTMENT | CUTANEOUS | Status: DC | PRN
  Administered 2020-03-27: 1 via TOPICAL

## 2020-03-27 MED ORDER — VIBEGRON 75 MG PO TABS
75.0000 mg | ORAL_TABLET | Freq: Every day | ORAL | Status: DC
  Administered 2020-03-28: 75 mg via ORAL
  Filled 2020-03-27: qty 30

## 2020-03-27 MED ORDER — POLYETHYLENE GLYCOL 3350 17 G PO PACK: 17.0000 g | PACK | Freq: Every day | ORAL | Status: DC | PRN

## 2020-03-27 MED ORDER — MIDAZOLAM HCL 5 MG/5ML IJ SOLN
INTRAMUSCULAR | Status: DC | PRN
  Administered 2020-03-27 (×2): 1 mg via INTRAVENOUS

## 2020-03-27 MED ORDER — REMIFENTANIL HCL 5 MG IV SOLR: 0.5000 ug/kg | Freq: Once | INTRAVENOUS | Status: DC

## 2020-03-27 MED ORDER — LIDOCAINE-EPINEPHRINE 1 %-1:100000 IJ SOLN
INTRAMUSCULAR | Status: AC
  Filled 2020-03-27: qty 1

## 2020-03-27 MED ORDER — ONDANSETRON HCL 4 MG/2ML IJ SOLN
INTRAMUSCULAR | Status: AC
  Filled 2020-03-27: qty 2

## 2020-03-27 MED ORDER — ALBUTEROL SULFATE (2.5 MG/3ML) 0.083% IN NEBU: 3.0000 mL | INHALATION_SOLUTION | RESPIRATORY_TRACT | Status: DC | PRN

## 2020-03-27 MED ORDER — ONDANSETRON HCL 4 MG PO TABS: 4.0000 mg | ORAL_TABLET | ORAL | Status: DC | PRN

## 2020-03-27 MED ORDER — SODIUM CHLORIDE 0.9 % IV SOLN
0.0500 ug/kg/min | INTRAVENOUS | Status: DC
  Administered 2020-03-27: .2 ug/kg/min via INTRAVENOUS
  Filled 2020-03-27: qty 5000

## 2020-03-27 MED ORDER — ROCURONIUM BROMIDE 10 MG/ML (PF) SYRINGE
PREFILLED_SYRINGE | INTRAVENOUS | Status: AC
  Filled 2020-03-27: qty 10

## 2020-03-27 MED ORDER — CEFAZOLIN SODIUM-DEXTROSE 2-4 GM/100ML-% IV SOLN
2.0000 g | INTRAVENOUS | Status: AC
  Administered 2020-03-27: 2 g via INTRAVENOUS
  Filled 2020-03-27: qty 100

## 2020-03-27 MED ORDER — BUPIVACAINE HCL (PF) 0.25 % IJ SOLN
INTRAMUSCULAR | Status: DC | PRN
  Administered 2020-03-27: 25 mL
  Administered 2020-03-27: 5 mL

## 2020-03-27 MED ORDER — DULOXETINE HCL 30 MG PO CPEP
30.0000 mg | ORAL_CAPSULE | Freq: Every day | ORAL | Status: DC
  Administered 2020-03-27 – 2020-03-28 (×2): 30 mg via ORAL
  Filled 2020-03-27 (×3): qty 1

## 2020-03-27 MED ORDER — FENTANYL CITRATE (PF) 250 MCG/5ML IJ SOLN
INTRAMUSCULAR | Status: AC
  Filled 2020-03-27: qty 5

## 2020-03-27 MED ORDER — DEXAMETHASONE SODIUM PHOSPHATE 10 MG/ML IJ SOLN
INTRAMUSCULAR | Status: DC | PRN
  Administered 2020-03-27: 4 mg via INTRAVENOUS
  Administered 2020-03-27: 6 mg via INTRAVENOUS

## 2020-03-27 MED ORDER — DEXAMETHASONE SODIUM PHOSPHATE 10 MG/ML IJ SOLN
INTRAMUSCULAR | Status: AC
  Filled 2020-03-27: qty 1

## 2020-03-27 MED ORDER — THROMBIN 5000 UNITS EX SOLR
CUTANEOUS | Status: DC | PRN
  Administered 2020-03-27 (×2): 5000 [IU] via TOPICAL

## 2020-03-27 MED ORDER — BISACODYL 10 MG RE SUPP: 10.0000 mg | Freq: Every day | RECTAL | Status: DC | PRN

## 2020-03-27 MED ORDER — CHLORHEXIDINE GLUCONATE 0.12 % MT SOLN
15.0000 mL | Freq: Once | OROMUCOSAL | Status: AC
  Filled 2020-03-27: qty 15

## 2020-03-27 MED ORDER — LIDOCAINE 2% (20 MG/ML) 5 ML SYRINGE
INTRAMUSCULAR | Status: AC
  Filled 2020-03-27: qty 5

## 2020-03-27 SURGICAL SUPPLY — 76 items
BAG DECANTER FOR FLEXI CONT (MISCELLANEOUS) ×2 IMPLANT
BAND RUBBER #18 3X1/16 STRL (MISCELLANEOUS) ×4 IMPLANT
BENZOIN TINCTURE PRP APPL 2/3 (GAUZE/BANDAGES/DRESSINGS) IMPLANT
BLADE CLIPPER SURG (BLADE) ×2 IMPLANT
BLADE ULTRA TIP 2M (BLADE) IMPLANT
BUR CUTTER 7.0 ROUND (BURR) ×2 IMPLANT
BUR MATCHSTICK NEURO 3.0 LAGG (BURR) IMPLANT
CABLE BIPOLOR RESECTION CORD (MISCELLANEOUS) IMPLANT
CANISTER SUCT 3000ML PPV (MISCELLANEOUS) ×2 IMPLANT
CARTRIDGE OIL MAESTRO DRILL (MISCELLANEOUS) IMPLANT
CLIP VESOCCLUDE MED 6/CT (CLIP) IMPLANT
COVER WAND RF STERILE (DRAPES) IMPLANT
DECANTER SPIKE VIAL GLASS SM (MISCELLANEOUS) IMPLANT
DERMABOND ADVANCED (GAUZE/BANDAGES/DRESSINGS) ×1
DERMABOND ADVANCED .7 DNX12 (GAUZE/BANDAGES/DRESSINGS) ×1 IMPLANT
DIFFUSER DRILL AIR PNEUMATIC (MISCELLANEOUS) IMPLANT
DRAPE LAPAROTOMY 100X72 PEDS (DRAPES) ×2 IMPLANT
DRAPE MICROSCOPE LEICA (MISCELLANEOUS) ×2 IMPLANT
DRAPE WARM FLUID 44X44 (DRAPES) ×2 IMPLANT
DURAMATRIX ONLAY 2X2 (Neuro Prosthesis/Implant) ×2 IMPLANT
ELECT CAUTERY BLADE 6.4 (BLADE) IMPLANT
ELECT REM PT RETURN 9FT ADLT (ELECTROSURGICAL) ×2
ELECTRODE REM PT RTRN 9FT ADLT (ELECTROSURGICAL) ×1 IMPLANT
EVACUATOR 1/8 PVC DRAIN (DRAIN) IMPLANT
EVACUATOR SILICONE 100CC (DRAIN) IMPLANT
GAUZE 4X4 16PLY RFD (DISPOSABLE) IMPLANT
GAUZE SPONGE 4X4 12PLY STRL (GAUZE/BANDAGES/DRESSINGS) IMPLANT
GLOVE BIO SURGEON STRL SZ 6.5 (GLOVE) ×2 IMPLANT
GLOVE BIO SURGEON STRL SZ7.5 (GLOVE) IMPLANT
GLOVE BIOGEL PI IND STRL 6.5 (GLOVE) ×1 IMPLANT
GLOVE BIOGEL PI IND STRL 7.5 (GLOVE) IMPLANT
GLOVE BIOGEL PI IND STRL 8 (GLOVE) ×1 IMPLANT
GLOVE BIOGEL PI IND STRL 8.5 (GLOVE) ×1 IMPLANT
GLOVE BIOGEL PI INDICATOR 6.5 (GLOVE) ×1
GLOVE BIOGEL PI INDICATOR 7.5 (GLOVE)
GLOVE BIOGEL PI INDICATOR 8 (GLOVE) ×1
GLOVE BIOGEL PI INDICATOR 8.5 (GLOVE) ×1
GLOVE ECLIPSE 8.0 STRL XLNG CF (GLOVE) ×2 IMPLANT
GLOVE ECLIPSE 8.5 STRL (GLOVE) ×2 IMPLANT
GLOVE EXAM NITRILE XL STR (GLOVE) IMPLANT
GLOVE SURG SS PI 7.5 STRL IVOR (GLOVE) ×12 IMPLANT
GOWN STRL REUS W/ TWL LRG LVL3 (GOWN DISPOSABLE) ×1 IMPLANT
GOWN STRL REUS W/ TWL XL LVL3 (GOWN DISPOSABLE) ×3 IMPLANT
GOWN STRL REUS W/TWL 2XL LVL3 (GOWN DISPOSABLE) ×2 IMPLANT
GOWN STRL REUS W/TWL LRG LVL3 (GOWN DISPOSABLE) ×1
GOWN STRL REUS W/TWL XL LVL3 (GOWN DISPOSABLE) ×3
GRAFT DURAGEN MATRIX 1WX1L (Tissue) ×2 IMPLANT
HEMOSTAT SURGICEL 2X14 (HEMOSTASIS) ×2 IMPLANT
KIT BASIN OR (CUSTOM PROCEDURE TRAY) ×2 IMPLANT
KIT TURNOVER KIT B (KITS) ×2 IMPLANT
NEEDLE HYPO 22GX1.5 SAFETY (NEEDLE) ×2 IMPLANT
NS IRRIG 1000ML POUR BTL (IV SOLUTION) ×6 IMPLANT
OIL CARTRIDGE MAESTRO DRILL (MISCELLANEOUS)
PACK CRANIOTOMY CUSTOM (CUSTOM PROCEDURE TRAY) IMPLANT
PACK LAMINECTOMY NEURO (CUSTOM PROCEDURE TRAY) ×2 IMPLANT
PAD ARMBOARD 7.5X6 YLW CONV (MISCELLANEOUS) ×6 IMPLANT
PATTIES SURGICAL 1/4 X 3 (GAUZE/BANDAGES/DRESSINGS) ×2 IMPLANT
SPONGE LAP 4X18 RFD (DISPOSABLE) IMPLANT
SPONGE NEURO XRAY DETECT 1X3 (DISPOSABLE) ×2 IMPLANT
SPONGE SURGIFOAM ABS GEL SZ50 (HEMOSTASIS) ×2 IMPLANT
STAPLER SKIN PROX WIDE 3.9 (STAPLE) IMPLANT
STRIP CLOSURE SKIN 1/4X4 (GAUZE/BANDAGES/DRESSINGS) IMPLANT
SUT ETHILON 3 0 FSL (SUTURE) IMPLANT
SUT NURALON 4 0 TR CR/8 (SUTURE) ×4 IMPLANT
SUT PROLENE 6 0 BV (SUTURE) ×4 IMPLANT
SUT VIC AB 1 CT1 18XBRD ANBCTR (SUTURE) ×1 IMPLANT
SUT VIC AB 1 CT1 8-18 (SUTURE) ×1
SUT VIC AB 2-0 CP2 18 (SUTURE) ×2 IMPLANT
SUT VIC AB 3-0 SH 8-18 (SUTURE) ×4 IMPLANT
SUT VIC AB 4-0 RB1 18 (SUTURE) ×2 IMPLANT
SYR CONTROL 10ML LL (SYRINGE) IMPLANT
TOWEL GREEN STERILE (TOWEL DISPOSABLE) ×2 IMPLANT
TOWEL GREEN STERILE FF (TOWEL DISPOSABLE) ×2 IMPLANT
TRAY FOLEY MTR SLVR 16FR STAT (SET/KITS/TRAYS/PACK) ×2 IMPLANT
UNDERPAD 30X36 HEAVY ABSORB (UNDERPADS AND DIAPERS) IMPLANT
WATER STERILE IRR 1000ML POUR (IV SOLUTION) ×2 IMPLANT

## 2020-03-27 NOTE — Op Note (Signed)
Date of surgery: March 27, 2020 Preoperative diagnosis: Chiari malformation with chronic headache Postoperative diagnosis: Same Procedure: Suboccipital decompression for Chiari malformation with duraplasty using operative microscope and microdissection technique Surgeon: Barnett Abu First Assistant: Monia Pouch, MD Second assistant: Kennon Portela, NP Anesthesia: General endotracheal Indications: Tamara Clark is a 24 year old lady who has had a Chiari malformation developing over the past few years.  She has had chronic unrelenting headaches that are explosive in nature and severe to the point of being disabling.  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  She does not have a syrinx and has no upper extremity symptoms of note but because of the persistence of the headaches she has been advised regarding surgical decompression of the Chiari malformation with a duraplasty.  Procedure: The patient was brought to the operating room supine on the stretcher.  After the smooth induction of general endotracheal anesthesia she was carefully placed on the 3 pin headrest and turned prone.  The neck was secured in a neutral position with a slight military flex.  The back of the scalp was then shaved and the skin was prepped with alcohol DuraPrep and draped in a sterile fashion.  A midline incision was made from the inion inferiorly to the spinous process of C4.  This was after the skin was infiltrated with half percent Marcaine mixed 50-50 with 1% lidocaine with epinephrine.  The dissection was taken down through the cervical dorsal fascia in the midline being careful to maintain the midline structures.  Suboccipital decompression was then performed once the muscle was stripped and a self-retaining retractor was placed into the wound to expose the occiput down to the region of the foramen magnum  the ring of C1 and the top of the arch of C2.  With the structures being isolated the thick fascial attachments to the ring of C1 were carefully dissected away and the dura was identified a 5 mm ball dissector was then used to create a Craney ectomy in the posterior fossa shaving down the bone layer by layer until the dura was identified.  Care was taken to do this slowly and carefully and ultimately a 2 cm tall craniectomy with the radius from the midline to either side was created.  The remnants of thin bone were taken up with a 2 and a 3 mm Kerrison punch hemostasis from dural bleeding was obtained with the bipolar cautery.  The ring of C1 was then removed posteriorly for a distance of approximately 3 cm side to side.  Once this decompression was completed the dura was noted to be tense and a midline durotomy was created using a #15 blade and a tenting suture.  The durotomy was then extended superiorly and inferiorly using a [facility] and a #15 blade the microscope was draped and brought into the field and carefully the dura was tented out to either side.  At the superior edge the occipital sinus was just slightly entered and this was closed with a singular 4-0 Nurolon suture.  A duraplasty patch made of DuraSeal measuring 2-1/2 cm in length and 1 cm in width was then fashioned in an elliptical fashion.  It was secured at the inferior edge with a singular 6-0 Prolene suture the sides of the dura were tented out to either side and secured to the muscle with 4-0 Nurolon suture and after the initial  suture was placed a running suture was placed around the perimetry of the duraplasty patch to create a more less watertight closure of the dura.  Prior to doing this some dissection of the arachnoid was performed so as to open a large arachnoid plane.  The dura was noted to be loose at this point and the tonsils themselves were not particularly matted posteriorly.  1 could see the diet section between the tonsils  and identify the top of the obex with these.  With this the dural patch was sewn in with a running 6-0 Prolene suture.  It did not require any supplemental stitches and the closure appeared watertight.  An onlay of DuraGen was placed over the outer aspect of the dural patch.  The wound was then checked for hemostasis the microscope was removed the retractor was removed 25 cc of half of quarter percent Marcaine was injected into the paraspinous musculature and once this was completed the closure was undertaken using #1 Vicryl in the muscle layers deeply and then 2-0 Vicryl in the subcu and the fascial layers more superficially and 3-0 Vicryl in a subcuticular skin.  Dermabond was placed on the skin.  Blood loss for the entire procedure was estimated at 50 cc.  Patient was removed from the 3 pin headrest turned supine and then returned to the recovery room in stable condition.

## 2020-03-27 NOTE — Plan of Care (Signed)
SpO2 100% on room air. Resp e/u.

## 2020-03-27 NOTE — Transfer of Care (Signed)
Immediate Anesthesia Transfer of Care Note  Patient: Tamara Clark  Procedure(s) Performed: Chiari decompression (N/A Head)  Patient Location: PACU  Anesthesia Type:General  Level of Consciousness: awake, alert , oriented, drowsy and patient cooperative  Airway & Oxygen Therapy: Patient Spontanous Breathing and Patient connected to nasal cannula oxygen  Post-op Assessment: Report given to RN and Post -op Vital signs reviewed and stable  Post vital signs: Reviewed and stable  Last Vitals:  Vitals Value Taken Time  BP 123/87 03/27/20 1134  Temp    Pulse 103 03/27/20 1135  Resp 17 03/27/20 1135  SpO2 100 % 03/27/20 1135  Vitals shown include unvalidated device data.  Last Pain:  Vitals:   03/27/20 0633  TempSrc:   PainSc: 2       Patients Stated Pain Goal: 0 (03/27/20 1224)  Complications: No complications documented.

## 2020-03-27 NOTE — Anesthesia Procedure Notes (Signed)
Procedure Name: Intubation Date/Time: 03/27/2020 7:55 AM Performed by: Raenette Rover, CRNA Pre-anesthesia Checklist: Patient identified, Emergency Drugs available, Suction available and Patient being monitored Patient Re-evaluated:Patient Re-evaluated prior to induction Oxygen Delivery Method: Circle system utilized Preoxygenation: Pre-oxygenation with 100% oxygen Induction Type: IV induction Ventilation: Mask ventilation without difficulty Laryngoscope Size: Mac and 3 Grade View: Grade I Tube type: Oral Tube size: 7.0 mm Number of attempts: 1 Airway Equipment and Method: Stylet Placement Confirmation: ETT inserted through vocal cords under direct vision,  positive ETCO2 and breath sounds checked- equal and bilateral Secured at: 21 cm Tube secured with: Tape Dental Injury: Teeth and Oropharynx as per pre-operative assessment  Comments: Inserted by Hoyt Koch SRNA

## 2020-03-27 NOTE — Anesthesia Preprocedure Evaluation (Signed)
Anesthesia Evaluation  Patient identified by MRN, date of birth, ID band Patient awake    Reviewed: Allergy & Precautions, NPO status , Patient's Chart, lab work & pertinent test results  History of Anesthesia Complications Negative for: history of anesthetic complications  Airway Mallampati: I  TM Distance: >3 FB Neck ROM: Full    Dental  (+) Dental Advisory Given, Teeth Intact   Pulmonary neg shortness of breath, asthma , neg recent URI,  Covid-19 Nucleic Acid Test Results Lab Results      Component                Value               Date                      SARSCOV2NAA              NEGATIVE            03/23/2020               breath sounds clear to auscultation       Cardiovascular negative cardio ROS   Rhythm:Regular     Neuro/Psych negative neurological ROS  negative psych ROS   GI/Hepatic negative GI ROS, Neg liver ROS,   Endo/Other  negative endocrine ROS  Renal/GU negative Renal ROS     Musculoskeletal negative musculoskeletal ROS (+)   Abdominal   Peds  Hematology negative hematology ROS (+) Lab Results      Component                Value               Date                      WBC                      4.5                 03/23/2020                HGB                      12.8                03/23/2020                HCT                      40.4                03/23/2020                MCV                      84.2                03/23/2020                PLT                      338                 03/23/2020              Anesthesia Other Findings   Reproductive/Obstetrics Lab Results  Component                Value               Date                      PREGTESTUR               NEGATIVE            03/27/2020                                        Anesthesia Physical Anesthesia Plan  ASA: II  Anesthesia Plan: General   Post-op Pain Management:     Induction: Intravenous  PONV Risk Score and Plan: 3 and Propofol infusion, TIVA and Midazolam  Airway Management Planned: Oral ETT  Additional Equipment: Arterial line  Intra-op Plan:   Post-operative Plan: Extubation in OR  Informed Consent: I have reviewed the patients History and Physical, chart, labs and discussed the procedure including the risks, benefits and alternatives for the proposed anesthesia with the patient or authorized representative who has indicated his/her understanding and acceptance.     Dental advisory given  Plan Discussed with: CRNA and Surgeon  Anesthesia Plan Comments:         Anesthesia Quick Evaluation

## 2020-03-27 NOTE — Anesthesia Procedure Notes (Addendum)
Arterial Line Insertion Start/End8/31/2021 8:05 AM, 03/27/2020 8:11 AM Performed by: Val Eagle, MD, Yolonda Kida, CRNA, CRNA  Preanesthetic checklist: patient identified, IV checked, site marked, risks and benefits discussed, surgical consent, monitors and equipment checked, pre-op evaluation, timeout performed and anesthesia consent Patient sedated Left, radial was placed Catheter size: 20 G Hand hygiene performed  and maximum sterile barriers used  Allen's test indicative of satisfactory collateral circulation Attempts: 2 Procedure performed without using ultrasound guided technique. Following insertion, Biopatch and dressing applied. Post procedure assessment: normal  Patient tolerated the procedure well with no immediate complications.

## 2020-03-27 NOTE — H&P (Signed)
Tamara Clark is an 24 y.o. female.   Chief Complaint: Headache, Chiari malformation HPI: Tamara Clark is a 24 year old individual whose had significant headaches for several years time.  They become progressively more incapacitating and years ago she was diagnosed with a Chiari malformation.  Because of the progression of symptoms repeat studies demonstrate that she is advanced the Chiari malformation I had evaluated her a number of months ago and we discussed consideration of surgical intervention.  Despite her efforts at conservative management with progressive symptoms she is decided to proceed with surgical decompression of the Chiari malformation effort to curtail the severity of the headaches and prevent neurologic impairment.  Past Medical History:  Diagnosis Date  . Arnold-Chiari malformation, type I (HCC)   . Asthma     Past Surgical History:  Procedure Laterality Date  . NO PAST SURGERIES      Family History  Adopted: Yes  Problem Relation Age of Onset  . Healthy Mother   . Healthy Father    Social History:  reports that she has never smoked. She has never used smokeless tobacco. She reports current alcohol use. She reports that she does not use drugs.  Allergies:  Allergies  Allergen Reactions  . Venlafaxine Anaphylaxis  . Doxycycline Hives  . Gabapentin Hives and Swelling  . Ondansetron Hcl Hives  . Chlorhexidine     rash  . Epinephrine Hives and Other (See Comments)    Heart racing  . Epitol [Carbamazepine]     Medications Prior to Admission  Medication Sig Dispense Refill  . albuterol (VENTOLIN HFA) 108 (90 Base) MCG/ACT inhaler Inhale 1-2 puffs into the lungs every 4 (four) hours as needed for wheezing or shortness of breath.     . diphenhydrAMINE (BENADRYL) 25 MG tablet Take 25 mg by mouth at bedtime.    . DULoxetine (CYMBALTA) 30 MG capsule Take 30 mg by mouth at bedtime.     Marland Kitchen eletriptan (RELPAX) 40 MG tablet Take 40 mg by mouth every 2 (two) hours as  needed for migraine.     . LO LOESTRIN FE 1 MG-10 MCG / 10 MCG tablet Take 1 tablet by mouth at bedtime.     Marland Kitchen omeprazole (PRILOSEC) 40 MG capsule Take 40 mg by mouth at bedtime.     . topiramate (TOPAMAX) 50 MG tablet Take 50 mg by mouth 2 (two) times daily.    . Vibegron (GEMTESA) 75 MG TABS Take 75 mg by mouth daily.      Results for orders placed or performed during the hospital encounter of 03/27/20 (from the past 48 hour(s))  Pregnancy, urine POC     Status: None   Collection Time: 03/27/20  6:20 AM  Result Value Ref Range   Preg Test, Ur NEGATIVE NEGATIVE    Comment:        THE SENSITIVITY OF THIS METHODOLOGY IS >24 mIU/mL   ABO/Rh     Status: None (Preliminary result)   Collection Time: 03/27/20  7:00 AM  Result Value Ref Range   ABO/RH(D) PENDING    No results found.  Review of Systems  HENT:       Headache  Eyes: Negative.   Respiratory: Negative.   Cardiovascular: Negative.   Gastrointestinal: Negative.   Endocrine: Negative.   Genitourinary: Negative.   Musculoskeletal: Positive for neck stiffness.  Neurological: Positive for light-headedness and headaches.  Hematological: Negative.   Psychiatric/Behavioral: Negative.     Blood pressure 121/75, pulse 100, temperature 98.4 F (36.9 C),  temperature source Oral, resp. rate 17, height 5' (1.524 m), weight 45.9 kg, SpO2 100 %. Physical Exam Constitutional:      Appearance: Normal appearance.  HENT:     Head: Normocephalic and atraumatic.     Nose: Nose normal.     Mouth/Throat:     Mouth: Mucous membranes are moist.  Eyes:     Extraocular Movements: Extraocular movements intact.     Pupils: Pupils are equal, round, and reactive to light.  Cardiovascular:     Rate and Rhythm: Normal rate and regular rhythm.     Pulses: Normal pulses.     Heart sounds: Normal heart sounds.  Pulmonary:     Effort: Pulmonary effort is normal.     Breath sounds: Normal breath sounds.  Abdominal:     General: Abdomen is  flat.     Palpations: Abdomen is soft.  Musculoskeletal:        General: Normal range of motion.     Cervical back: Normal range of motion and neck supple.  Skin:    General: Skin is warm and dry.  Neurological:     General: No focal deficit present.     Mental Status: She is alert.     Cranial Nerves: Cranial nerve deficit present.     Motor: No weakness.     Gait: Gait normal.     Comments: Positive Lhermitte's phenomenon with extension  Psychiatric:        Mood and Affect: Mood normal.        Behavior: Behavior normal.        Thought Content: Thought content normal.        Judgment: Judgment normal.      Assessment/Plan Chiari malformation, chronic headaches.  Plan: Posterior decompression of Chiari malformation with duraplasty.  Stefani Dama, MD 03/27/2020, 7:35 AM

## 2020-03-28 ENCOUNTER — Encounter (HOSPITAL_COMMUNITY): Payer: Self-pay | Admitting: Neurological Surgery

## 2020-03-28 MED ORDER — PANTOPRAZOLE SODIUM 40 MG PO TBEC
40.0000 mg | DELAYED_RELEASE_TABLET | Freq: Two times a day (BID) | ORAL | Status: DC
  Administered 2020-03-28 – 2020-03-29 (×2): 40 mg via ORAL
  Filled 2020-03-28 (×2): qty 1

## 2020-03-28 MED ORDER — METHOCARBAMOL 500 MG PO TABS
500.0000 mg | ORAL_TABLET | Freq: Four times a day (QID) | ORAL | Status: DC | PRN
  Administered 2020-03-28 (×2): 500 mg via ORAL
  Filled 2020-03-28 (×3): qty 1

## 2020-03-28 MED ORDER — HYDROMORPHONE HCL 2 MG PO TABS
2.0000 mg | ORAL_TABLET | ORAL | Status: DC | PRN
  Administered 2020-03-28 – 2020-03-29 (×7): 2 mg via ORAL
  Filled 2020-03-28 (×7): qty 1

## 2020-03-28 MED ORDER — ENSURE ENLIVE PO LIQD
237.0000 mL | Freq: Two times a day (BID) | ORAL | Status: DC
  Administered 2020-03-28 – 2020-03-29 (×2): 237 mL via ORAL

## 2020-03-28 MED ORDER — VIBEGRON 75 MG PO TABS
75.0000 mg | ORAL_TABLET | Freq: Every day | ORAL | Status: DC
  Administered 2020-03-29: 75 mg via ORAL
  Filled 2020-03-28: qty 30

## 2020-03-28 NOTE — Progress Notes (Signed)
Orthopedic Tech Progress Note Patient Details:  Tamara Clark 05/26/1996 [id number]  Ortho Devices Type of Ortho Device: Soft collar Ortho Device/Splint Interventions: Application, Ordered   Post Interventions Patient Tolerated: Well   [name] A [name] 03/28/2020, 6:29 PM

## 2020-03-28 NOTE — Progress Notes (Signed)
  Neurosurgery Service Progress Note  Subjective: No acute events overnight. Patient is having headaches at the base of her head and neck pain. Pt is requiring substantial amount of pain medication to control her pain. Overall, she is doing reasonable well  Objective: Vitals:   03/28/20 0400 03/28/20 0500 03/28/20 0600 03/28/20 0700  BP: (!) 87/55 103/72 130/78 95/67  Pulse: 74 92 (!) 101 77  Resp: 13 (!) 26 (!) 22 16  Temp: 98.1 F (36.7 C)     TempSrc: Oral     SpO2: 99% 100% 100% 98%  Weight:      Height:       Temp (24hrs), Avg:98.1 F (36.7 C), Min:97.7 F (36.5 C), Max:98.5 F (36.9 C)  CBC Latest Ref Rng & Units 03/23/2020 05/06/2019  WBC 4.0 - 10.5 K/uL 4.5 4.8  Hemoglobin 12.0 - 15.0 g/dL 51.7 00.1  Hematocrit 36 - 46 % 40.4 40.5  Platelets 150 - 400 K/uL 338 301   BMP Latest Ref Rng & Units 03/23/2020 05/06/2019  Glucose 70 - 99 mg/dL 92 83  BUN 6 - 20 mg/dL 18 12  Creatinine 7.49 - 1.00 mg/dL 4.49 6.75  Sodium 916 - 145 mmol/L 138 141  Potassium 3.5 - 5.1 mmol/L 3.8 3.7  Chloride 98 - 111 mmol/L 107 104  CO2 22 - 32 mmol/L 20(L) 27  Calcium 8.9 - 10.3 mg/dL 9.3 9.7    Intake/Output Summary (Last 24 hours) at 03/28/2020 3846 Last data filed at 03/27/2020 2000 Gross per 24 hour  Intake 1400 ml  Output 1475 ml  Net -75 ml    Current Facility-Administered Medications:  .  albuterol (PROVENTIL) (2.5 MG/3ML) 0.083% nebulizer solution 3 mL, 3 mL, Inhalation, Q4H PRN, Kennon Portela, NP .  bisacodyl (DULCOLAX) suppository 10 mg, 10 mg, Rectal, Daily PRN, Kennon Portela, NP .  docusate sodium (COLACE) capsule 100 mg, 100 mg, Oral, BID, Kennon Portela, NP .  DULoxetine (CYMBALTA) DR capsule 30 mg, 30 mg, Oral, QHS, Kennon Portela, NP, 30 mg at 03/27/20 2220 .  labetalol (NORMODYNE) injection 10-40 mg, 10-40 mg, Intravenous, Q10 min PRN, Kennon Portela, NP .  pantoprazole (PROTONIX) injection 40 mg, 40 mg, Intravenous, Q12H, Dawley, Troy C, DO, 40 mg at 03/27/20 2220 .  polyethylene  glycol (MIRALAX / GLYCOLAX) packet 17 g, 17 g, Oral, Daily PRN, Kennon Portela, NP .  prochlorperazine (COMPAZINE) tablet 10 mg, 10 mg, Oral, Q6H PRN **OR** prochlorperazine (COMPAZINE) injection 5-10 mg, 5-10 mg, Intravenous, Q6H PRN, Dawley, Troy C, DO .  promethazine (PHENERGAN) tablet 12.5-25 mg, 12.5-25 mg, Oral, Q4H PRN, Kennon Portela, NP .  senna (SENOKOT) tablet 8.6 mg, 1 tablet, Oral, BID, Cono Gebhard, NP .  sodium phosphate (FLEET) 7-19 GM/118ML enema 1 enema, 1 enema, Rectal, Once PRN, Kennon Portela, NP .  Vibegron TABS 75 mg, 75 mg, Oral, Daily, Kennon Portela, NP   Physical Exam: Alert and oriented x 4 PERRLA CN II-XII grossly intact MAE, Strength and sensation intact Incision is covered dermabond; Dressing is clean, dry, and intact  Assessment & Plan: 24 y.o. s/p posterior decompression of Chiari malformation with duraplasty recovering well. -will transfer patient out of ICU -take out arterial line -D/C dilaudid IV  -D/C Norco PO -Transition to Dilaudid PO -order soft cervical collar -order Methocarbamol 500 mg PO q6h as needed for muscle spasms

## 2020-03-29 MED ORDER — DEXAMETHASONE 1 MG PO TABS: 1.0000 mg | ORAL_TABLET | ORAL | 0 refills | Status: AC

## 2020-03-29 MED ORDER — METHOCARBAMOL 500 MG PO TABS: 500.0000 mg | ORAL_TABLET | Freq: Four times a day (QID) | ORAL | 0 refills | Status: AC | PRN

## 2020-03-29 MED ORDER — PROMETHAZINE HCL 12.5 MG PO TABS: 12.5000 mg | ORAL_TABLET | Freq: Four times a day (QID) | ORAL | 0 refills | Status: AC | PRN

## 2020-03-29 MED ORDER — HYDROMORPHONE HCL 2 MG PO TABS: 2.0000 mg | ORAL_TABLET | ORAL | 0 refills | Status: AC | PRN

## 2020-03-29 NOTE — Discharge Summary (Signed)
Physician Discharge Summary  Patient ID: Tamara Clark MRN: [id number] DOB/AGE: 19-Jun-1996 24 y.o.  Admit date: 03/27/2020 Discharge date: 03/29/2020  Admission Diagnoses:Chiari malformation with chronic headache  Discharge Diagnoses:  Chiari malformation with chronic headache Active Problems:   Chiari I malformation (HCC)   Discharged Condition: good  Hospital Course: uncomplicated/stable  Consults: None  Significant Diagnostic Studies:   Treatments: Suboccipital decompression for Chiari malformation with duraplasty using operative microscope and microdissection technique  Discharge Exam: Blood pressure 121/74, pulse (!) 110, temperature 98.5 F (36.9 C), temperature source Oral, resp. rate 16, height 5' (1.524 m), weight 45.9 kg, SpO2 96 %. Alert and oriented x 4 PERRLA CN II-XII grossly intact MAE, Strength and sensation intact Incision is covered with Dermabond; Dressing is clean, dry, and intact Pt is wearing a cervical collar. Discharge instructions given.   Disposition: Discharge disposition: 01-Home or Self Care        Allergies as of 03/29/2020      Reactions   Venlafaxine Anaphylaxis   Doxycycline Hives   Gabapentin Hives, Swelling   Ondansetron Hcl Hives   Chlorhexidine    rash   Epinephrine Hives, Other (See Comments)   Heart racing   Epitol [carbamazepine]       Medication List    TAKE these medications   albuterol 108 (90 Base) MCG/ACT inhaler Commonly known as: VENTOLIN HFA Inhale 1-2 puffs into the lungs every 4 (four) hours as needed for wheezing or shortness of breath.   dexamethasone 1 MG tablet Commonly known as: DECADRON Take 1 tablet (1 mg total) by mouth See admin instructions.   diphenhydrAMINE 25 MG tablet Commonly known as: BENADRYL Take 25 mg by mouth at bedtime.   DULoxetine 30 MG capsule Commonly known as: CYMBALTA Take 30 mg by mouth at bedtime.   eletriptan 40 MG tablet Commonly known as: RELPAX Take 40 mg by mouth  every 2 (two) hours as needed for migraine.   Gemtesa 75 MG Tabs Generic drug: Vibegron Take 75 mg by mouth daily.   HYDROmorphone 2 MG tablet Commonly known as: Dilaudid Take 1 tablet (2 mg total) by mouth every 4 (four) hours as needed for up to 5 days for severe pain (as needed for pain).   Lo Loestrin Fe 1 MG-10 MCG / 10 MCG tablet Generic drug: Norethindrone-Ethinyl Estradiol-Fe Biphas Take 1 tablet by mouth at bedtime.   methocarbamol 500 MG tablet Commonly known as: Robaxin Take 1 tablet (500 mg total) by mouth every 6 (six) hours as needed for muscle spasms.   omeprazole 40 MG capsule Commonly known as: PRILOSEC Take 40 mg by mouth at bedtime.   promethazine 12.5 MG tablet Commonly known as: PHENERGAN Take 1 tablet (12.5 mg total) by mouth every 6 (six) hours as needed for nausea or vomiting.   topiramate 50 MG tablet Commonly known as: TOPAMAX Take 50 mg by mouth 2 (two) times daily.        Signed: Kennon Portela 03/29/2020, 9:04 AM

## 2020-03-29 NOTE — Anesthesia Postprocedure Evaluation (Signed)
Anesthesia Post Note  Patient: Tamara Clark  Procedure(s) Performed: Chiari decompression (N/A Head)     Patient location during evaluation: PACU Anesthesia Type: General Level of consciousness: awake and alert Pain management: pain level controlled Vital Signs Assessment: post-procedure vital signs reviewed and stable Respiratory status: spontaneous breathing, nonlabored ventilation, respiratory function stable and patient connected to nasal cannula oxygen Cardiovascular status: blood pressure returned to baseline and stable Postop Assessment: no apparent nausea or vomiting Anesthetic complications: no   No complications documented.  Last Vitals:  Vitals:   03/29/20 0328 03/29/20 0752  BP: 126/78 121/74  Pulse: (!) 105 (!) 110  Resp: 16 16  Temp: 37.1 C 36.9 C  SpO2: 97% 96%    Last Pain:  Vitals:   03/29/20 0752  TempSrc: Oral  PainSc:                  Tamara Clark

## 2020-03-29 NOTE — Discharge Instructions (Signed)
Discharge Instructions   slowly increase your activity back to normal. Walk daily  Your incision is closed with dermabond (purple glue). This will naturally fall off over the next 1-2 weeks.   Okay to shower on the day of discharge. Use regular soap and water and try to be gentle when cleaning your incision.   Follow up with Dr. Danielle Dess in 2 weeks after discharge. If you do not already have a discharge appointment, please call his office at [phone] to schedule a follow up appointment. If you have any concerns or questions, please call the office and let us know.

## 2020-03-29 NOTE — Progress Notes (Signed)
Pt with d/c orders. Discharge paperwork reviewed with pt and all questions answered. IV removed. Printed prescription gave to pt and other prescriptions electronically faxed to pharmacy. Pt escorted out via wheelchair with all belongings to private vehicle.

## 2020-05-14 ENCOUNTER — Encounter: Payer: Self-pay | Admitting: Physical Therapy

## 2020-05-14 ENCOUNTER — Other Ambulatory Visit: Payer: Self-pay

## 2020-05-14 ENCOUNTER — Ambulatory Visit: Payer: BC Managed Care – PPO | Attending: Surgery | Admitting: Physical Therapy

## 2020-05-14 DIAGNOSIS — R208 Other disturbances of skin sensation: Secondary | ICD-10-CM

## 2020-05-14 DIAGNOSIS — R2681 Unsteadiness on feet: Secondary | ICD-10-CM

## 2020-05-14 DIAGNOSIS — R42 Dizziness and giddiness: Secondary | ICD-10-CM | POA: Diagnosis not present

## 2020-05-14 NOTE — Therapy (Signed)
[facility] [address] [place], [place], [address] Phone: [phone]   Fax:  [phone]  Physical Therapy Evaluation  Patient Details  Name: Tamara Clark MRN: [id number] Date of Birth: July 26, 1996 Referring Provider (PT): Kennon Portela, FNP   Encounter Date: 05/14/2020   PT End of Session - 05/14/20 1734    Visit Number 1    Number of Visits 7    Date for PT Re-Evaluation 06/28/20    Authorization Type BCBS - 2021, VL: 30 PT/OT combined    PT Start Time 1022    PT Stop Time 1108    PT Time Calculation (min) 46 min    Activity Tolerance Patient tolerated treatment well    Behavior During Therapy [facility] for tasks assessed/performed           Past Medical History:  Diagnosis Date  . Arnold-Chiari malformation, type I (HCC)   . Asthma     Past Surgical History:  Procedure Laterality Date  . NO PAST SURGERIES    . SUBOCCIPITAL CRANIECTOMY CERVICAL LAMINECTOMY N/A 03/27/2020   Procedure: Chiari decompression;  Surgeon: Barnett Abu, MD;  Location: [facility] OR;  Service: Neurosurgery;  Laterality: N/A;  posterior    There were no vitals filed for this visit.    Subjective Assessment - 05/14/20 1028    Subjective Before decompression surgery pt reported constant dizziness, visual impairments, night blindness, diplopia, decreased sensation in LE, weakness, falls.  After surgery - has return of sensation, resolution of night blindness, peripheral vision has improved.  No further HA but is having movement HA - sit > stand, turning quickly, sudden movements - last a few seconds but can progress to migraine.  Continues to have weakness, slurring of speech, and dizzy spells.  Did have a fall with a L shoulder fracture - goes to see orthopedist today about shoulder, unsure about precautions or activity restrictions.    Pertinent History recent fall with L shoulder fracture, asthma, arnold-chiari malformation, acute cystitis,  anxiety, gastritis, UTI, fibromyalgia, multiple ear infections - antibiotic use    Diagnostic tests MRI showed L mastoid effusion    Patient Stated Goals To address the vertigo    Currently in Pain? Yes    Pain Location Incision    Pain Orientation Posterior    Pain Descriptors / Indicators Other (Comment);Tightness   itching, tightness   Pain Type Surgical pain;Neuropathic pain              [facility] PT Assessment - 05/14/20 1035      Assessment   Medical Diagnosis Chiari Malformation type 1 with chronic HAj    Referring Provider (PT) Kennon Portela, FNP    Onset Date/Surgical Date 05/09/20    Prior Therapy yes for knees, balance      Precautions   Precautions Fall;Other (comment)    Precaution Comments recent fall with L shoulder injury; asthma, arnold-chiari malformation, acute cystitis, anxiety, gastritis, UTI, fibromyalgia, ear infections and antibiotic use      Restrictions   Weight Bearing Restrictions No    Other Position/Activity Restrictions no      Balance Screen   Has the patient fallen in the past 6 months Yes    How many times? 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    Leisure horseback riding      Observation/Other Assessments   Focus on Therapeutic Outcomes (FOTO)  76%    Other Surveys  Dizziness Handicap Inventory (DHI)    Dizziness Handicap Inventory (DHI)  26 mild      Sensation   Light Touch Impaired Detail    Light Touch  Impaired Details Impaired RUE;Impaired LUE   bilat radial, L median, R ulnar decreased to light touch     Coordination   Finger Nose Finger Test [facility]    Heel Shin Test slightly dysmetric       ROM / Strength   AROM / PROM / Strength Strength      Strength   Overall Strength Within functional limits for tasks performed    Overall Strength Comments in bilat LE      Ambulation/Gait   Ambulation/Gait Yes    Ambulation/Gait Assistance 7: Independent    Ambulation Distance (Feet) 1000 Feet    Assistive device None    Gait Pattern Within Functional Limits    Ambulation Surface Level;Indoor      Functional Gait  Assessment   Gait assessed  Yes    Gait Level Surface Walks 20 ft, slow speed, abnormal gait pattern, evidence for imbalance or deviates 10-15 in outside of the 12 in walkway width. Requires more than 7 sec to ambulate 20 ft.   9 seconds   Change in Gait Speed Able to smoothly change walking speed without loss of balance or gait deviation. Deviate no more than 6 in outside of the 12 in walkway width.    Gait with Horizontal Head Turns Performs head turns smoothly with no change in gait. Deviates no more than 6 in outside 12 in walkway width    Gait with Vertical Head Turns Performs head turns with no change in gait. Deviates no more than 6 in outside 12 in walkway width.    Gait and Pivot Turn Pivot turns safely within 3 sec and stops quickly with no loss of balance.    Step Over Obstacle Is able to step over 2 stacked shoe boxes taped together (9 in total height) without changing gait speed. No evidence of imbalance.    Gait with Narrow Base of Support Is able to ambulate for 10 steps heel to toe with no staggering.    Gait with Eyes Closed Walks 20 ft, uses assistive device, slower speed, mild gait deviations, deviates 6-10 in outside 12 in walkway width. Ambulates 20 ft in less than 9 sec but greater than 7 sec.    Ambulating Backwards Walks 20 ft, no assistive devices, good speed,  no evidence for imbalance, normal gait    Steps Alternating feet, no rail.    Total Score 27    FGA comment: no dizziness; 27/30                  Vestibular Assessment - 05/14/20 1054      Symptom Behavior   Subjective history of current problem denies nausea or vomiting; denies tinnitus or  changes in hearing, denies aural fullness.      Type of Dizziness  Spinning    Frequency of Dizziness every few days    Duration of Dizziness seconds > longer turns into a migraine    Symptom Nature Spontaneous    Aggravating Factors No known aggravating factors    Relieving Factors No known relieving factors    Progression of Symptoms Worse      Oculomotor Exam   Oculomotor Alignment Normal    Ocular ROM WFL    Spontaneous Absent    Gaze-induced  Absent    Smooth Pursuits Intact    Saccades Intact    Comment Convergence intact      Oculomotor Exam-Fixation Suppressed    Left Head Impulse positive    Right Head Impulse negative      Vestibulo-Ocular Reflex   VOR to Slow Head Movement Normal    VOR Cancellation Normal              Objective measurements completed on examination: See above findings.               PT Education - 05/14/20 1734    Education Details clinical findings, PT POC and goals    Person(s) Educated Patient    Methods Explanation    Comprehension Verbalized understanding               PT Long Term Goals - 05/14/20 1746      PT LONG TERM GOAL #1   Title Pt will demonstrate independence with final vestibular/balance HEP    Time 6    Period Weeks    Status New    Target Date 06/28/20      PT LONG TERM GOAL #2   Title Pt will improve FGA score by 3 points to indicate decreased falls risk    Baseline 27/30    Time 6    Period Weeks    Status New    Target Date 06/28/20      PT LONG TERM GOAL #3   Title Pt will report improvement in overall function on FOTO to >/= 88% and will decrease DHI by 10 points.    Baseline 76%;  DHI: 26    Time 6    Period Weeks    Status New    Target Date 06/28/20      PT LONG TERM GOAL #4   Title DVA goal if indicated      PT LONG TERM GOAL #5   Title MSQ goal if indicated                  Plan - 05/14/20 1735    Clinical Impression Statement Pt is a 24 year old female referred to Neuro OPPT for evaluation of dizziness following decompression surgery for Arnold-Chiari Malformation.  Pt's PMH is significant for the following: asthma, arnold-chiari malformation, acute cystitis, anxiety, gastritis, UTI, fibromyalgia, ear infections, chronic antibiotic use and a recent fall with L shoulder fracture. The following deficits were noted during pt's exam: headache and neuropathic pain at incision site but no limitation in neck ROM, L shoulder pain, impaired sensation to light touch in digits, disequilibrium, impaired VOR, and impaired balance when vision removed.  Pt's FGA score indicates pt is at low risk for falls. Pt would benefit from skilled PT to address these impairments and functional limitations to maximize functional mobility independence and reduce falls risk.    Personal Factors and Comorbidities Comorbidity 3+;Profession  Comorbidities recent fall with L shoulder fracture, asthma, arnold-chiari malformation, acute cystitis, anxiety, gastritis, UTI, fibromyalgia, ear infections, chronic antibiotic use    Examination-Activity Limitations Lift;Bend;Reach Overhead    Examination-Participation Restrictions Community Activity;Occupation;Driving    Stability/Clinical Decision Making Stable/Uncomplicated    Clinical Decision Making Low    Rehab Potential Good    PT Frequency 1x / week    PT Duration 6 weeks    PT Treatment/Interventions ADLs/Self Care Home Management;Canalith Repostioning;Functional mobility training;Therapeutic activities;Therapeutic exercise;Balance training;Neuromuscular re-education;Patient/family education;Vestibular    PT Next Visit Plan Did she see  orthopedic doctor about L shoulder fracture-any restrictions?  finish vestibular - DVA, positional vertigo, MSQ.  Initiate HEP - VOR x1 viewing, balance with eyes closed    Consulted and Agree with Plan of Care Patient           Patient will benefit from skilled therapeutic intervention in order to improve the following deficits and impairments:  Dizziness, Impaired vision/preception, Decreased balance, Impaired sensation, Pain  Visit Diagnosis: Dizziness and giddiness  Unsteadiness on feet  Other disturbances of skin sensation     Problem List Patient Active Problem List   Diagnosis Date Noted  . Chiari I malformation (HCC) 03/27/2020    Dierdre Highman, PT, DPT 05/14/20    5:50 PM     [facility] [address] [place], [place], [address] Phone: [phone]   Fax:  [phone]  Name: Tamara Clark MRN: [id number] Date of Birth: 08-30-1995

## 2020-05-21 ENCOUNTER — Encounter: Payer: Self-pay | Admitting: Physical Therapy

## 2020-05-21 ENCOUNTER — Other Ambulatory Visit: Payer: Self-pay

## 2020-05-21 ENCOUNTER — Ambulatory Visit: Payer: BC Managed Care – PPO | Admitting: Physical Therapy

## 2020-05-21 DIAGNOSIS — R208 Other disturbances of skin sensation: Secondary | ICD-10-CM

## 2020-05-21 DIAGNOSIS — R42 Dizziness and giddiness: Secondary | ICD-10-CM

## 2020-05-21 DIAGNOSIS — R2681 Unsteadiness on feet: Secondary | ICD-10-CM

## 2020-05-21 NOTE — Therapy (Signed)
[facility] [address] [place], [place], [address] Phone: [phone]   Fax:  [phone]  Physical Therapy Treatment  Patient Details  Name: Tamara Clark MRN: [id number] Date of Birth: 25-Mar-1996 Referring Provider (PT): Kennon Portela, FNP   Encounter Date: 05/21/2020   PT End of Session - 05/21/20 1654    Visit Number 2    Number of Visits 7    Date for PT Re-Evaluation 06/28/20    Authorization Type BCBS - 2021, VL: 30 PT/OT combined    PT Start Time 1450    PT Stop Time 1535    PT Time Calculation (min) 45 min    Activity Tolerance Patient tolerated treatment well    Behavior During Therapy [facility] for tasks assessed/performed           Past Medical History:  Diagnosis Date  . Arnold-Chiari malformation, type I (HCC)   . Asthma     Past Surgical History:  Procedure Laterality Date  . NO PAST SURGERIES    . SUBOCCIPITAL CRANIECTOMY CERVICAL LAMINECTOMY N/A 03/27/2020   Procedure: Chiari decompression;  Surgeon: Barnett Abu, MD;  Location: [facility] OR;  Service: Neurosurgery;  Laterality: N/A;  posterior    There were no vitals filed for this visit.   Subjective Assessment - 05/21/20 1455    Subjective No issues to report.  Has not returned to work due to 10 day waiting period.  Went to see orthopedic physician who did a follow up x-ray and no fracture seen on follow up.  No restrictions or limitations recommended by physician.  Pt does have improved strength and ROM, still sore.  Still having spontaneous dizziness; HA are better.    Pertinent History recent fall with L shoulder injury, asthma, arnold-chiari malformation, acute cystitis, anxiety, gastritis, UTI, fibromyalgia, multiple ear infections - antibiotic use    Diagnostic tests MRI showed L mastoid effusion    Patient Stated Goals To address the vertigo    Currently in Pain? Yes    Pain Score 4     Pain Location Head    Pain Descriptors / Indicators  Headache    Pain Type Neuropathic pain                   Vestibular Assessment - 05/21/20 1458      Vestibular Assessment   General Observation reports dizziness this past week dizziness would begin spontaneously when standing and when supine, no triggering movements.  Thinks it may be medication related.  Since surgery she has been experiencing more side effects of medication.      Visual Acuity   Static 7    Dynamic 6   no dizziness     Positional Testing   Dix-Hallpike Dix-Hallpike Right;Dix-Hallpike Left    Horizontal Canal Testing Horizontal Canal Right;Horizontal Canal Left      Dix-Hallpike Right   Dix-Hallpike Right Duration 0    Dix-Hallpike Right Symptoms No nystagmus      Dix-Hallpike Left   Dix-Hallpike Left Duration 0    Dix-Hallpike Left Symptoms No nystagmus      Horizontal Canal Right   Horizontal Canal Right Duration 0    Horizontal Canal Right Symptoms Normal      Horizontal Canal Left   Horizontal Canal Left Duration 0    Horizontal Canal Left Symptoms Normal      Positional Sensitivities   Sit to Supine No dizziness    Supine to Left Side No dizziness  Supine to Right Side No dizziness    Supine to Sitting No dizziness    Right Hallpike No dizziness    Up from Right Hallpike No dizziness    Up from Left Hallpike No dizziness    Nose to Right Knee No dizziness   Headache   Right Knee to Sitting No dizziness    Nose to Left Knee No dizziness    Left Knee to Sitting No dizziness   headache   Head Turning x 5 No dizziness    Head Nodding x 5 Mild dizziness    Pivot Right in Standing No dizziness    Pivot Left in Standing No dizziness    Rolling Right No dizziness    Rolling Left No dizziness                     Vestibular Treatment/Exercise - 05/21/20 1511      Vestibular Treatment/Exercise   Vestibular Treatment Provided Gaze    Gaze Exercises X1 Viewing Horizontal;X1 Viewing Vertical      X1 Viewing Horizontal    Foot Position standing solid surface, feet together    Reps 2    Comments 30 seconds > 60 seconds.  Mild symptoms      X1 Viewing Vertical   Foot Position standing solid surface, feet together    Reps 2    Comments 30 seconds > 60 seconds.  Mild symptoms              Balance Exercises - 05/21/20 1652      Balance Exercises: Standing   Tandem Stance Eyes open;Intermittent upper extremity support;Other reps (comment);Limitations    Tandem Stance Time R and L foot forwards while performing 10 reps head turns and head nods; greater difficulty with R foot forwards.  No dizziness but pt reporting nausea.    Turning Right;Left;Other reps (comment);Limitations    Turning Limitations Over 20' performed forwards walking and then backwards walking x 2 laps down and back with 180 deg turns alternating between R and L turns.  Mild dizziness after performing             PT Education - 05/21/20 1654    Education Details initiated HEP    Person(s) Educated Patient    Methods Explanation;Demonstration;Handout    Comprehension Verbalized understanding;Returned demonstration            Gaze Stabilization: Standing Feet Together    Feet together, keeping eyes on target on wall __3__ feet away, tilt head down 15-30 and move head side to side for __60__ seconds. Repeat while moving head up and down for __60__ seconds. Do __2__ sessions per day.   Feet Heel-Toe "Tandem", Head Motion - Eyes Open    With eyes open, right foot directly in front of the other, move head slowly: left and right 10 times.  Then move head slowly: up and down 10 times. Switch feet so left foot is directly in front and repeat 10 head turns and 10 head up/down. Repeat __1__ times per session. Do __2__ sessions per day.   Walking With 180 Degree Turn    Walk forward along a long hallway.  At the end Turn 180 to the right forwards back to the start, turn 180 degrees to the left and keep walking. Repeat  walking forwards with right and left turns 2-3 laps. Then change to walking backwards with Right and Left turns, 2-3 laps.          PT  Long Term Goals - 05/21/20 1657      PT LONG TERM GOAL #1   Title Pt will demonstrate independence with final vestibular/balance HEP    Time 6    Period Weeks    Status New    Target Date 06/28/20      PT LONG TERM GOAL #2   Title Pt will improve FGA score by 3 points to indicate decreased falls risk    Baseline 27/30    Time 6    Period Weeks    Status New    Target Date 06/28/20      PT LONG TERM GOAL #3   Title Pt will report improvement in overall function on FOTO to >/= 88% and will decrease DHI by 10 points.    Baseline 76%; DHI: 26    Time 6    Period Weeks    Status New    Target Date 06/28/20      PT LONG TERM GOAL #4   Title DVA goal if indicated    Status Deferred      PT LONG TERM GOAL #5   Title Pt will report 0/5 dizziness and HA with bending down to the ground and when performing horizontal and vertical head/body turns in order for pt to successfully return to all work activities.    Time 6    Period Weeks    Status New    Target Date 06/28/20                 Plan - 05/21/20 1654    Clinical Impression Statement Continued vestibular assessment with MSQ and DVA.  [facility] with only one line difference but did cause mild symptoms of dizziness.  Pt demonstrated very mild motion sensitivity to bending down to the ground and vertical head movements.  Initiated HEP focusing on standing balance with narrow BOS, gaze adaptation and walking with quick body turns.  Pt tolerated well with only mild increase in dizziness, HA and nausea.  Will continue to progress towards LTG.    Personal Factors and Comorbidities Comorbidity 3+;Profession    Comorbidities recent fall with L shoulder fracture, asthma, arnold-chiari malformation, acute cystitis, anxiety, gastritis, UTI, fibromyalgia, ear infections, chronic antibiotic  use    Examination-Activity Limitations Lift;Bend;Reach Overhead    Examination-Participation Restrictions Community Activity;Occupation;Driving    Stability/Clinical Decision Making Stable/Uncomplicated    Rehab Potential Good    PT Frequency 1x / week    PT Duration 6 weeks    PT Treatment/Interventions ADLs/Self Care Home Management;Canalith Repostioning;Functional mobility training;Therapeutic activities;Therapeutic exercise;Balance training;Neuromuscular re-education;Patient/family education;Vestibular    PT Next Visit Plan Progress HEP - VOR x1 viewing, balance with eyes closed, tandem, bending down to the ground and squatting for work.    Consulted and Agree with Plan of Care Patient           Patient will benefit from skilled therapeutic intervention in order to improve the following deficits and impairments:  Dizziness, Impaired vision/preception, Decreased balance, Impaired sensation, Pain  Visit Diagnosis: Dizziness and giddiness  Unsteadiness on feet  Other disturbances of skin sensation     Problem List Patient Active Problem List   Diagnosis Date Noted  . Chiari I malformation (HCC) 03/27/2020    Dierdre HighmanAudra F Shantell Belongia, PT, DPT 05/21/20    4:59 PM    [facility] [address] [place], [place], [address] Phone: [phone]   Fax:  [phone]  Name: Tamara Clark MRN:  [id number] Date of Birth: 1996/05/11

## 2020-05-21 NOTE — Patient Instructions (Signed)
Gaze Stabilization: Standing Feet Together    Feet together, keeping eyes on target on wall __3__ feet away, tilt head down 15-30 and move head side to side for __60__ seconds. Repeat while moving head up and down for __60__ seconds. Do __2__ sessions per day.   Feet Heel-Toe "Tandem", Head Motion - Eyes Open    With eyes open, right foot directly in front of the other, move head slowly: left and right 10 times.  Then move head slowly: up and down 10 times. Switch feet so left foot is directly in front and repeat 10 head turns and 10 head up/down. Repeat __1__ times per session. Do __2__ sessions per day.   Walking With 180 Degree Turn    Walk forward along a long hallway.  At the end Turn 180 to the right forwards back to the start, turn 180 degrees to the left and keep walking. Repeat walking forwards with right and left turns 2-3 laps. Then change to walking backwards with Right and Left turns, 2-3 laps.

## 2020-06-01 ENCOUNTER — Other Ambulatory Visit: Payer: Self-pay

## 2020-06-01 ENCOUNTER — Ambulatory Visit: Payer: BC Managed Care – PPO | Attending: Surgery | Admitting: Physical Therapy

## 2020-06-01 ENCOUNTER — Encounter: Payer: Self-pay | Admitting: Physical Therapy

## 2020-06-01 DIAGNOSIS — R208 Other disturbances of skin sensation: Secondary | ICD-10-CM | POA: Insufficient documentation

## 2020-06-01 DIAGNOSIS — R2681 Unsteadiness on feet: Secondary | ICD-10-CM

## 2020-06-01 DIAGNOSIS — R42 Dizziness and giddiness: Secondary | ICD-10-CM | POA: Insufficient documentation

## 2020-06-01 NOTE — Therapy (Signed)
[facility] [address] [place], [place], [address] Phone: [phone]   Fax:  [phone]  Physical Therapy Treatment  Patient Details  Name: Tamara Clark MRN: [id number] Date of Birth: 01/20/1996 Referring Provider (PT): Kennon Portela, FNP   Encounter Date: 06/01/2020   PT End of Session - 06/01/20 1647    Visit Number 3    Number of Visits 7    Date for PT Re-Evaluation 06/28/20    Authorization Type BCBS - 2021, VL: 30 PT/OT combined    PT Start Time 1400    PT Stop Time 1445    PT Time Calculation (min) 45 min    Activity Tolerance Patient tolerated treatment well    Behavior During Therapy [facility] for tasks assessed/performed           Past Medical History:  Diagnosis Date  . Arnold-Chiari malformation, type I (HCC)   . Asthma     Past Surgical History:  Procedure Laterality Date  . NO PAST SURGERIES    . SUBOCCIPITAL CRANIECTOMY CERVICAL LAMINECTOMY N/A 03/27/2020   Procedure: Chiari decompression;  Surgeon: Barnett Abu, MD;  Location: [facility] OR;  Service: Neurosurgery;  Laterality: N/A;  posterior    There were no vitals filed for this visit.   Subjective Assessment - 06/01/20 1406    Subjective Is on antibiotic and steroid due to sinus infection.  Shoulder is feeling better while she is on steroid.  Is back at work doing light duty.  Has had some lightheadedness but no dizziness; cut HA medicine in half.  Was able to work on exercises some but not consistently.    Pertinent History recent fall with L shoulder injury, asthma, arnold-chiari malformation, acute cystitis, anxiety, gastritis, UTI, fibromyalgia, multiple ear infections - antibiotic use    Diagnostic tests MRI showed L mastoid effusion    Patient Stated Goals To address the vertigo    Currently in Pain? No/denies                              Vestibular Treatment/Exercise - 06/01/20 1415      Vestibular Treatment/Exercise     Vestibular Treatment Provided Gaze    Gaze Exercises X1 Viewing Horizontal;X1 Viewing Vertical      X1 Viewing Horizontal   Foot Position feet together, solid surface and then compliant.  Also attempted with busy background    Reps 4    Comments 60 seconds, no symptom and no LOB      X1 Viewing Vertical   Foot Position feet together, solid surface and then compliant.  Also attempted with busy background    Comments 60 seconds, no symptom and no LOB              Balance Exercises - 06/01/20 1450      Balance Exercises: Standing   Standing Eyes Closed Narrow base of support (BOS);Foam/compliant surface;Solid surface;Head turns;Limitations;20 secs;30 secs    Standing Eyes Closed Limitations feet together on foam holding balance 20-30 seconds x 2 reps; removed foam and stood with feet in tandem on solid surface with EC holding 20-30 seconds    Tandem Stance Eyes open;Other reps (comment);Limitations    Tandem Stance Time R then L foot forwards, head turns/nods x 10 reps with no symptoms of dizziness or LOB    Other Standing Exercises Walking down hallway with one finger on wall forwards and backwards with EC x 2 reps with  slight turning to the L.  No dizziness    Other Standing Exercises Comments Walking forwards x 30' with turns to L and R and bending down to the floor to pick up, carry and then place cone on the floor x 6 reps x 2 sets.  No dizziness but pt reported HA with bending down and coming back up.  HA resolves quickly once back in standing             PT Education - 06/01/20 1646    Education Details removed x1 viewing from HEP; progressed balance    Person(s) Educated Patient    Methods Explanation;Demonstration;Handout    Comprehension Verbalized understanding;Returned demonstration          Feet Heel-Toe "Tandem", Head Motion - Eyes Closed    With eyes closed and right foot directly in front of the other, hold 20-30 seconds.  Repeat and then switch feet and  repeat 2 times. To make this harder you can add in head nods and head turns if you feel stable. Repeat __2__ times per session. Do __1__ sessions per day.  Copyright  VHI. All rights reserved.    Feet Together (Compliant Surface) Head Motion - Eyes Closed    Stand on compliant surface: pillow or cushion with feet together. Close eyes and hold 20-30 seconds.  Repeat. To make harder you can ad head turns and head nods if you feel stable. Repeat 2 times per session. Do 1 sessions per day.   Walking With 180 Degree Turn    Walk forward along a long hallway.  At the end Turn 180 to the right forwards back to the start, turn 180 degrees to the left and keep walking. Repeat walking forwards with right and left turns 2-3 laps. Then change to walking backwards with Right and Left turns, 2-3 laps.   Walking: Forwards and backwards with eyes closed    Standing in your hallway with one finger on the wall.  Close eyes, walk forwards to end of hall and then backwards trying to keep a straight path. Repeat 3-4 times.            PT Long Term Goals - 05/21/20 1657      PT LONG TERM GOAL #1   Title Pt will demonstrate independence with final vestibular/balance HEP    Time 6    Period Weeks    Status New    Target Date 06/28/20      PT LONG TERM GOAL #2   Title Pt will improve FGA score by 3 points to indicate decreased falls risk    Baseline 27/30    Time 6    Period Weeks    Status New    Target Date 06/28/20      PT LONG TERM GOAL #3   Title Pt will report improvement in overall function on FOTO to >/= 88% and will decrease DHI by 10 points.    Baseline 76%; DHI: 26    Time 6    Period Weeks    Status New    Target Date 06/28/20      PT LONG TERM GOAL #4   Title DVA goal if indicated    Status Deferred      PT LONG TERM GOAL #5   Title Pt will report 0/5 dizziness and HA with bending down to the ground and when performing horizontal and vertical head/body turns  in order for pt to successfully return to all work activities.  Time 6    Period Weeks    Status New    Target Date 06/28/20                 Plan - 06/01/20 1647    Clinical Impression Statement Pt continues to demonstrate no symptoms of dizziness or imbalance when performing x1 viewing, even with patterned background.  Removed from HEP and continued to focus on balance with vision removed and habituation to bending down to the ground.  Pt tolerated well with only a small increase in HA.  Will continue to progress towards LTG.    Personal Factors and Comorbidities Comorbidity 3+;Profession    Comorbidities recent fall with L shoulder fracture, asthma, arnold-chiari malformation, acute cystitis, anxiety, gastritis, UTI, fibromyalgia, ear infections, chronic antibiotic use    Examination-Activity Limitations Lift;Bend;Reach Overhead    Examination-Participation Restrictions Community Activity;Occupation;Driving    Stability/Clinical Decision Making Stable/Uncomplicated    Rehab Potential Good    PT Frequency 1x / week    PT Duration 6 weeks    PT Treatment/Interventions ADLs/Self Care Home Management;Canalith Repostioning;Functional mobility training;Therapeutic activities;Therapeutic exercise;Balance training;Neuromuscular re-education;Patient/family education;Vestibular    PT Next Visit Plan Continue to work on balance with eyes closed; bending down to the ground and picking up heavier items (works at [employer], 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 Chiari I malformation (HCC) 03/27/2020    Dierdre Highman, PT, DPT 06/01/20    4:50 PM    [facility] [address] [place], [place], [address] Phone: [phone]   Fax:  [phone]  Name: Tamara Clark MRN: [id number] Date of Birth: July 05, 1996

## 2020-06-01 NOTE — Patient Instructions (Addendum)
Feet Heel-Toe "Tandem", Head Motion - Eyes Closed    With eyes closed and right foot directly in front of the other, hold 20-30 seconds.  Repeat and then switch feet and repeat 2 times. To make this harder you can add in head nods and head turns if you feel stable. Repeat __2__ times per session. Do __1__ sessions per day.  Copyright  VHI. All rights reserved.    Feet Together (Compliant Surface) Head Motion - Eyes Closed    Stand on compliant surface: pillow or cushion with feet together. Close eyes and hold 20-30 seconds.  Repeat. To make harder you can ad head turns and head nods if you feel stable. Repeat 2 times per session. Do 1 sessions per day.   Walking With 180 Degree Turn    Walk forward along a long hallway.  At the end Turn 180 to the right forwards back to the start, turn 180 degrees to the left and keep walking. Repeat walking forwards with right and left turns 2-3 laps. Then change to walking backwards with Right and Left turns, 2-3 laps.   Walking: Forwards and backwards with eyes closed    Standing in your hallway with one finger on the wall.  Close eyes, walk forwards to end of hall and then backwards trying to keep a straight path. Repeat 3-4 times.

## 2020-06-15 ENCOUNTER — Ambulatory Visit: Payer: Medicaid Other | Admitting: Physical Therapy

## 2020-06-18 ENCOUNTER — Telehealth: Payer: Self-pay

## 2020-06-18 ENCOUNTER — Ambulatory Visit: Payer: Medicaid Other

## 2020-06-18 NOTE — Telephone Encounter (Signed)
PT attempted to call patient due to missing Physical Therapy appointment on 11/22. No answer and unable to leave voicemail message.    Adelfa Koh, PT, DPT

## 2020-06-29 ENCOUNTER — Ambulatory Visit: Payer: 59 | Admitting: Physical Therapy

## 2020-07-06 ENCOUNTER — Ambulatory Visit: Payer: 59 | Attending: Surgery | Admitting: Physical Therapy

## 2020-08-11 ENCOUNTER — Other Ambulatory Visit: Payer: Self-pay | Admitting: Neurological Surgery

## 2020-08-11 DIAGNOSIS — G935 Compression of brain: Secondary | ICD-10-CM

## 2020-08-14 ENCOUNTER — Encounter: Payer: Self-pay | Admitting: Physical Therapy

## 2020-08-14 NOTE — Therapy (Signed)
[facility] [address] [place], [place], [address] Phone: [phone]   Fax:  [phone]  Patient Details  Name: Tamara Clark MRN: [id number] Date of Birth: 1995/08/25 Referring Provider:  No ref. provider found  Encounter Date: 08/14/2020   PHYSICAL THERAPY DISCHARGE SUMMARY  Visits from Start of Care: 3  Current functional level related to goals / functional outcomes: Unable to formally assess.  Pt was making good progress towards goals.  Pt had to cancel multiple sessions; pt contacted by this therapist to reschedule visits and inquire if pt wanted to continue with therapy.  LVM and patient did not return phone calls.  Due to no return PT will proceed with D/C today.  Pt will need new referral if she needs to return to outpatient therapy.     Remaining deficits: Mild dizziness   Education / Equipment: HEP  Plan: Patient agrees to discharge.  Patient goals were not met. Patient is being discharged due to not returning since the last visit.  ?????     Rico Junker, PT, DPT 08/14/20    10:59 AM    [facility] [address] [place], [place], [address] Phone: [phone]   Fax:  [phone]

## 2020-08-21 ENCOUNTER — Ambulatory Visit
Admission: RE | Admit: 2020-08-21 | Discharge: 2020-08-21 | Disposition: A | Discharge status: Home / Self Care | Payer: 59 | Source: Ambulatory Visit | Attending: Neurological Surgery | Admitting: Neurological Surgery

## 2020-08-21 ENCOUNTER — Other Ambulatory Visit: Payer: Self-pay

## 2020-08-21 DIAGNOSIS — G935 Compression of brain: Secondary | ICD-10-CM

## 2020-08-21 IMAGING — MR MR HEAD W/O CM
8 series · 48 of 48 positions shown · non-contrast
Comparison: MR head without contrast [DATE]

CLINICAL DATA: Chiari malformation. Status post decompression
[DATE].

EXAM:
MRI HEAD WITHOUT CONTRAST
TECHNIQUE: Multiplanar, multiecho pulse sequences of the brain and surrounding
structures were obtained without intravenous contrast.

[Series 2: T1 · sagittal · 5.0mm · 0.45mm/px · 3 of 21 slices shown]
[im 1/21]
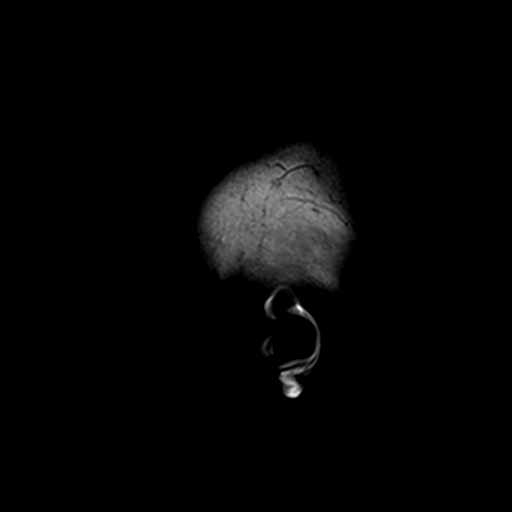
[im 11/21]
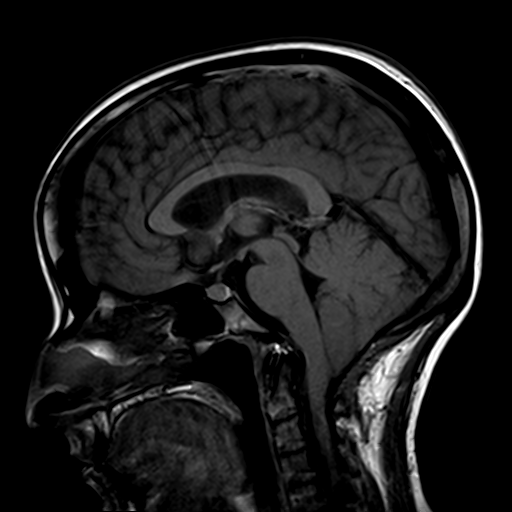
[im 21/21]
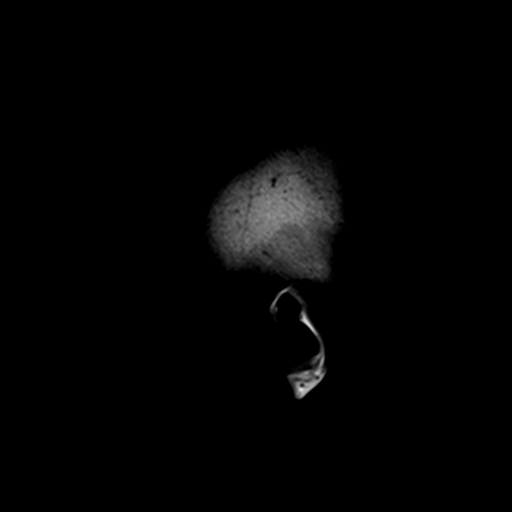

[Series 3: DWI · axial · 3.0mm · 1.80mm/px · z∈[-71,+76]mm · 12 of 100 slices shown (1 of 2)]
[im 1/100]
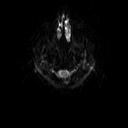
[im 10/100]
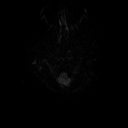
[im 19/100]
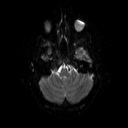
[im 28/100]
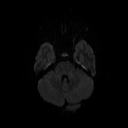
[im 37/100]
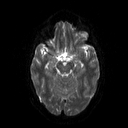
[im 46/100]
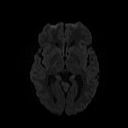
[im 55/100]
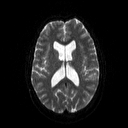
[im 64/100]
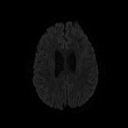
[im 73/100]
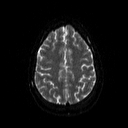
[im 82/100]
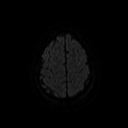
[im 91/100]
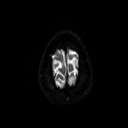
[im 100/100]
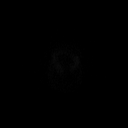

[Series 4: DWI · axial · 3.0mm · 1.80mm/px · z∈[-71,+76]mm · 5 of 47 slices shown (2 of 2)]
[im 1/47]
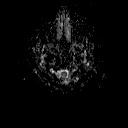
[im 12/47]
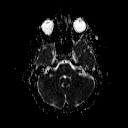
[im 24/47]
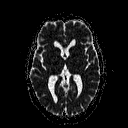
[im 35/47]
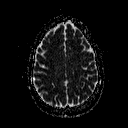
[im 47/47]
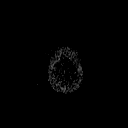

[Series 5: T2 · axial · 5.0mm · 0.60mm/px · z∈[-78,+69]mm · 2 of 22 slices shown (1 of 2)]
[im 1/22]
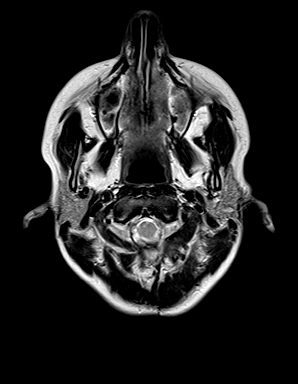
[im 22/22]
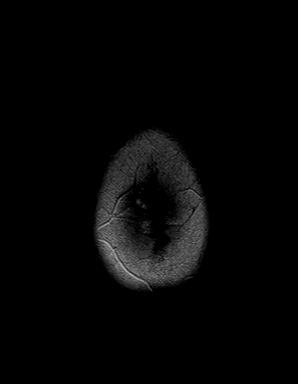

[Series 6: FLAIR · axial · 3.0mm · 0.45mm/px · z∈[-70,+64]mm · 3 of 30 slices shown]
[im 1/30]
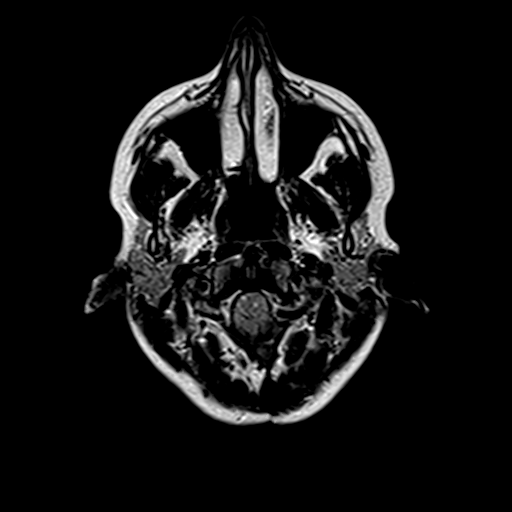
[im 15/30]
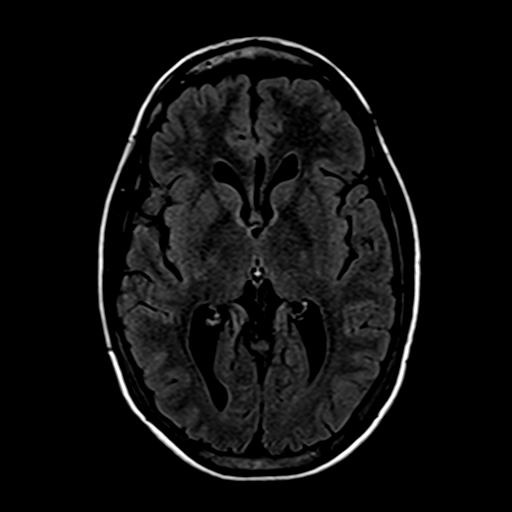
[im 30/30]
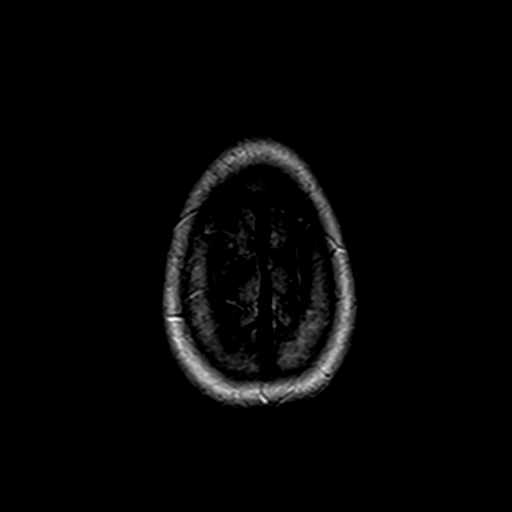

[Series 8: swi_images · axial · 4.0mm · 0.90mm/px · z∈[-73,+66]mm · 4 of 36 slices shown]
[im 1/36]
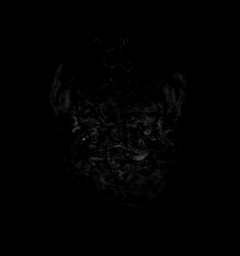
[im 12/36]
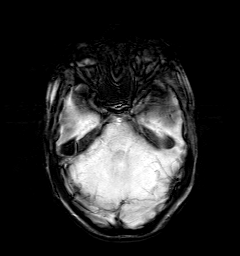
[im 24/36]
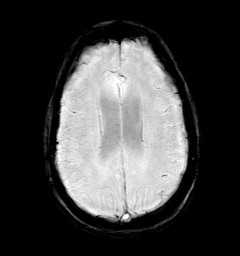
[im 36/36]
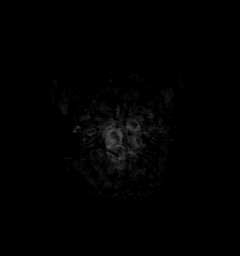

[Series 9: t1_mpr_tra · axial · 1.0mm · 0.71mm/px · z∈[-76,+67]mm · 16 of 144 slices shown]
[im 1/144]
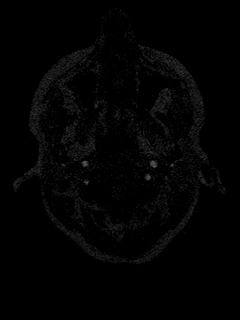
[im 10/144]
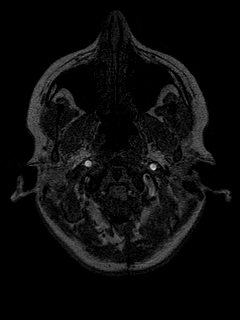
[im 20/144]
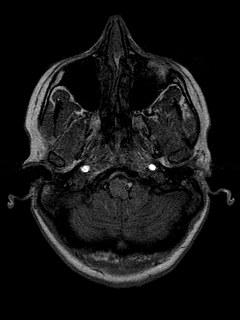
[im 29/144]
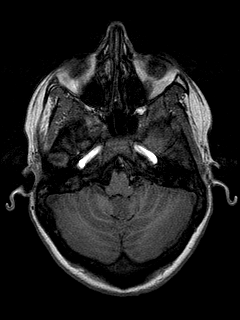
[im 39/144]
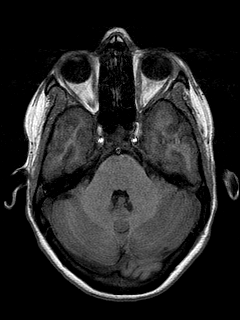
[im 48/144]
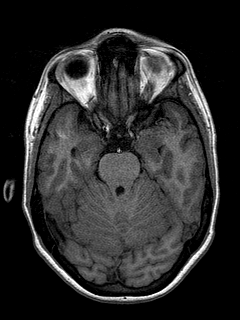
[im 58/144]
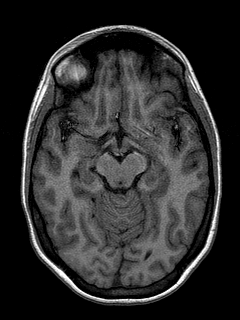
[im 67/144]
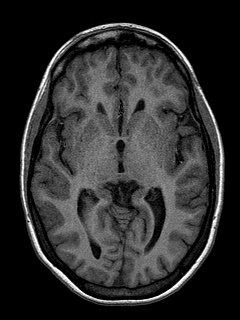
[im 77/144]
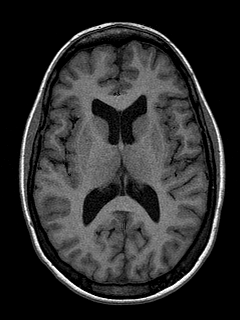
[im 86/144]
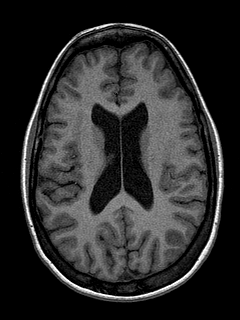
[im 96/144]
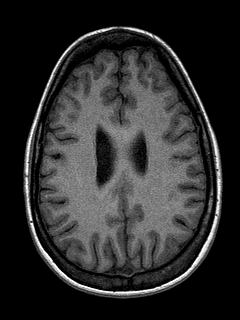
[im 105/144]
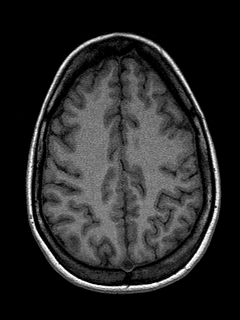
[im 115/144]
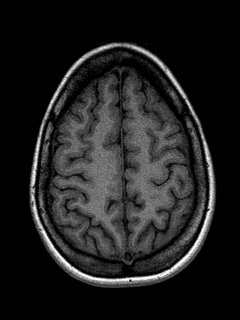
[im 124/144]
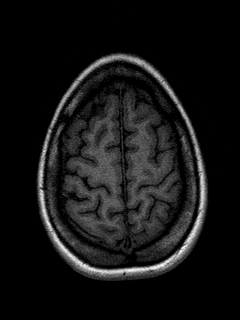
[im 134/144]
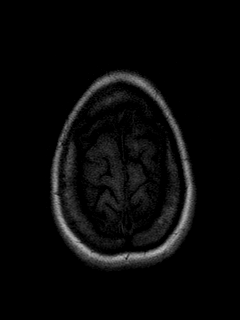
[im 144/144]
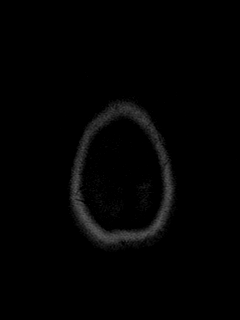

[Series 10: T2 · coronal · 5.0mm · 0.45mm/px · 3 of 25 slices shown (2 of 2)]
[im 1/25]
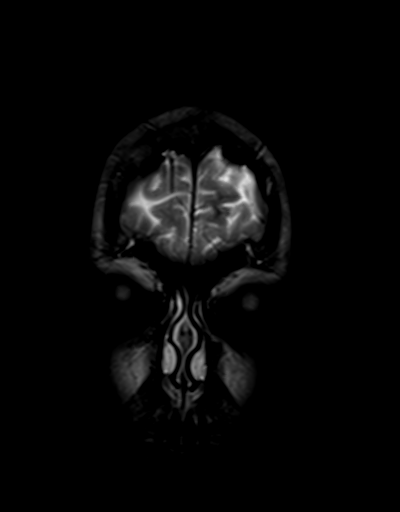
[im 13/25]
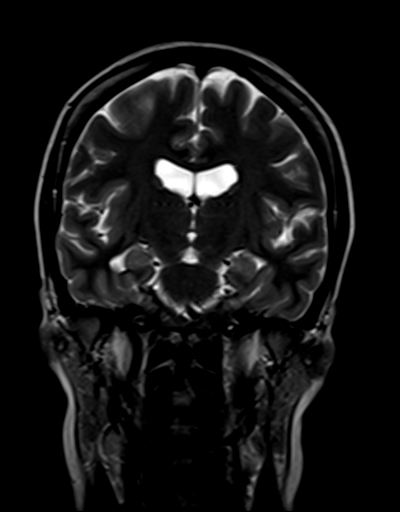
[im 25/25]
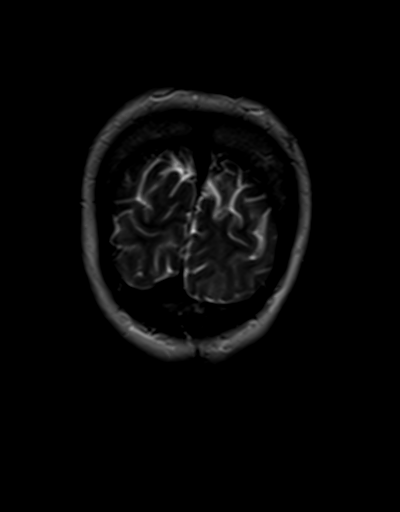

[48 of 48 positions shown; findings below may reference images not displayed]

FINDINGS: Brain: No acute infarct, hemorrhage, or mass lesion is present.
Cerebellar tonsils remain somewhat pointed. Suboccipital craniotomy
noted with decompression of the posterior fossa. Fourth ventricle is
within normal limits. Fluid spaces ventral to the brainstem are
normal. No abnormal signal is present within the cerebellar tonsils.

Supratentorial structures are within normal limits. No acute
infarct, hemorrhage, or mass lesion is present. The ventricles are
of normal size. No significant extraaxial fluid collection is
present.

Vascular: Normal flow voids.

Skull and upper cervical spine: Insert normal skull no significant
extracranial soft tissue lesion is present.

Sinuses/Orbits: Fluid is present in the mastoid air cells
bilaterally. Paranasal sinuses are clear. No obstructing
nasopharyngeal lesion is present. The globes and orbits are within
normal limits.
IMPRESSION: 1. Suboccipital craniotomy with decompression of the posterior
fossa.
2. No acute intracranial abnormality or significant interval change.
3. Cerebellar tonsils remain somewhat pointed. No abnormal signal is
present within the cerebellar tonsils.
4. Bilateral mastoid effusions. No obstructing nasopharyngeal lesion
is present.

## 2021-07-10 ENCOUNTER — Emergency Department (HOSPITAL_COMMUNITY)
Admission: EM | Admit: 2021-07-10 | Discharge: 2021-07-11 | Disposition: A | Discharge status: Home / Self Care | Payer: 59 | Attending: Student | Admitting: Student

## 2021-07-10 DIAGNOSIS — R42 Dizziness and giddiness: Secondary | ICD-10-CM | POA: Diagnosis present

## 2021-07-10 DIAGNOSIS — J45909 Unspecified asthma, uncomplicated: Secondary | ICD-10-CM | POA: Insufficient documentation

## 2021-07-10 MED ORDER — MECLIZINE HCL 25 MG PO TABS
25.0000 mg | ORAL_TABLET | Freq: Once | ORAL | Status: AC
  Administered 2021-07-11: 25 mg via ORAL
  Filled 2021-07-10 (×2): qty 1

## 2021-07-10 NOTE — ED Provider Notes (Addendum)
MSE was initiated and I personally evaluated the patient and placed orders (if any) at  11:50 PM on July 10, 2021.  Patient with h/o vertigo having recurrent symptoms, now with nausea, mild headache. Neuro f/u soon. H/O chiari malformation.   Today's Vitals   07/10/21 2345 07/10/21 2350  BP:  (!) 117/92  Pulse:  97  Resp:  13  Temp:  98.3 F (36.8 C)  TempSrc:  Oral  SpO2:  100%  PainSc: 0-No pain    There is no height or weight on file to calculate BMI.  In NAD, VSS Ambulatory, steady  The patient appears stable so that the remainder of the MSE may be completed by another provider.   Elpidio Anis, PA-C 07/10/21 2352    Elpidio Anis, PA-C 07/10/21 Arletha Grippe    Tilden Fossa, MD 07/11/21 [phone]

## 2021-07-10 NOTE — ED Triage Notes (Signed)
Pt c/o vertigo x "a few days," states she had to leave work early today r/t dizziness. Hx arnold-chiari malformation, recurrent issues w vertigo, takes meclizine for symptoms but hasn't needed it "in a few years." Follow up w neurology next week. Also endorses nausea x1hr.

## 2021-07-11 ENCOUNTER — Emergency Department (HOSPITAL_COMMUNITY): Payer: 59

## 2021-07-11 ENCOUNTER — Other Ambulatory Visit: Payer: Self-pay

## 2021-07-11 IMAGING — CT CT HEAD W/O CM
4 series · 15 of 47 positions shown, 17 images · non-contrast
Comparison: Correlation made with prior MRI

CLINICAL DATA: Vertigo, central

EXAM:
CT HEAD WITHOUT CONTRAST
TECHNIQUE: Contiguous axial images were obtained from the base of the skull
through the vertex without intravenous contrast.

[Series 2: head without · axial · non-contrast · 0.51mm/px · z∈[-86,+29]mm · 7 of 31 slices shown, 9 images]
[im 4/31  brain]
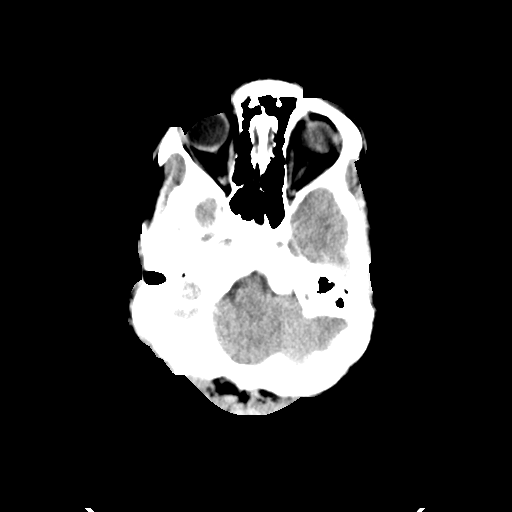
[im 4/31  bone]
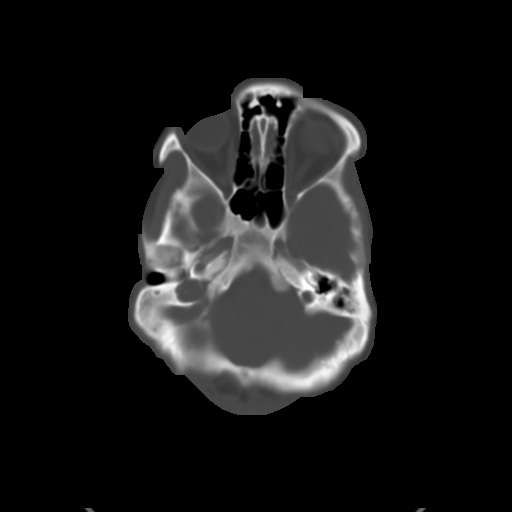
[im 8/31  brain]
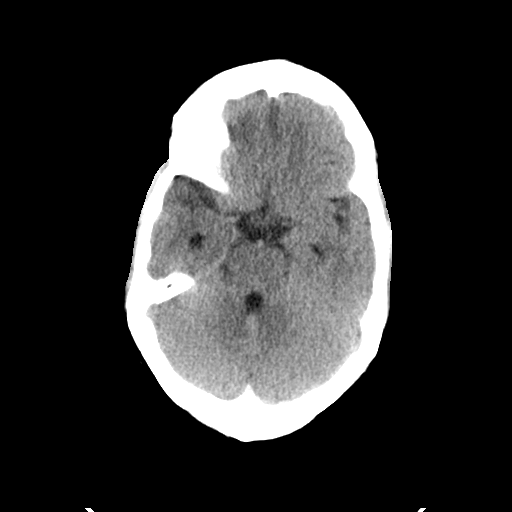
[im 12/31  brain]
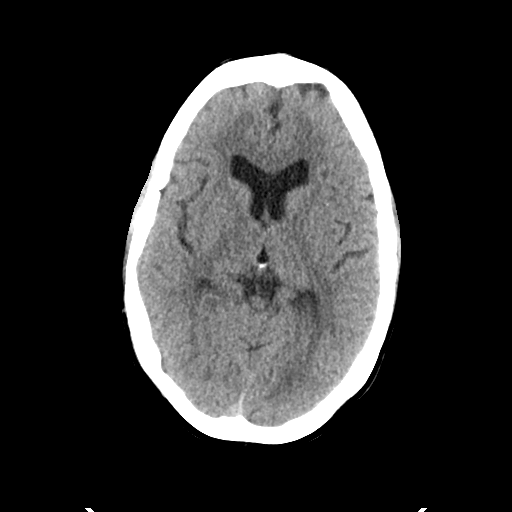
[im 16/31  brain]
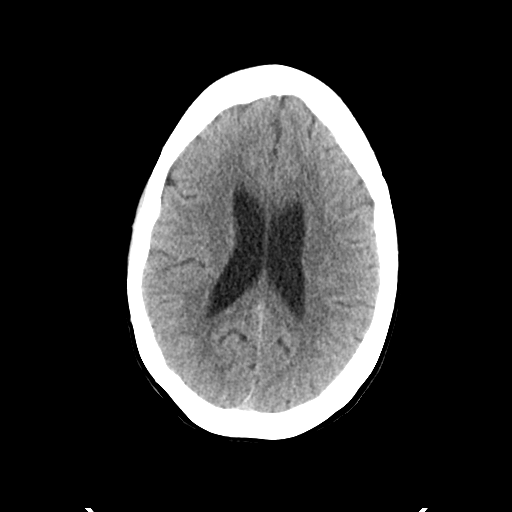
[im 19/31  brain]
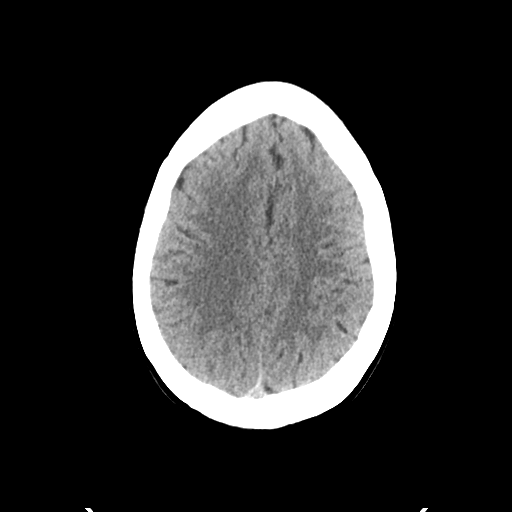
[im 19/31  bone]
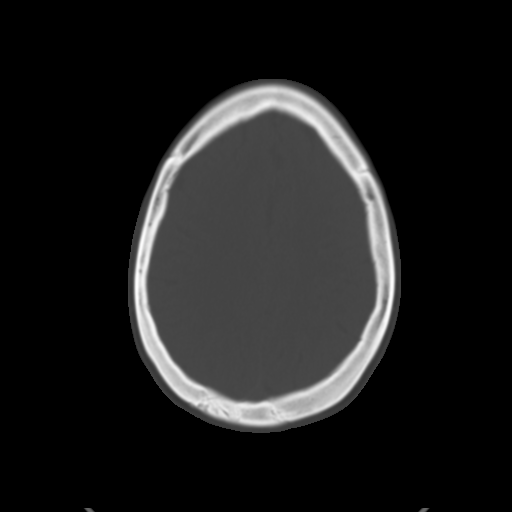
[im 23/31  brain]
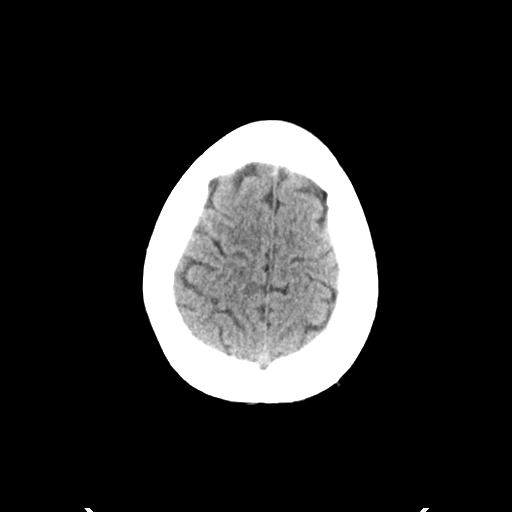
[im 27/31  brain]
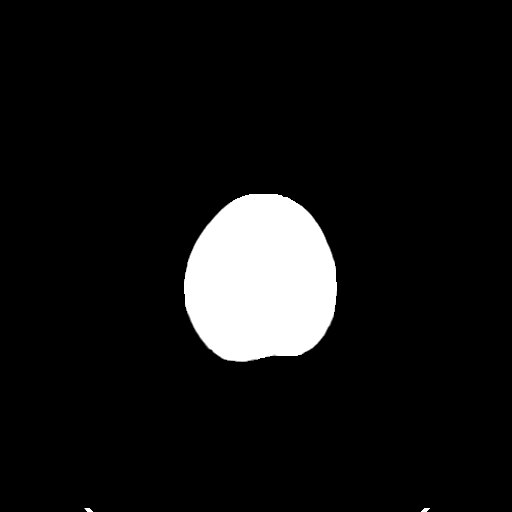

[Series 3: head bone · axial · 0.51mm/px · z∈[-87,-71]mm · 2 of 76 slices shown]
[im 8/76  bone]
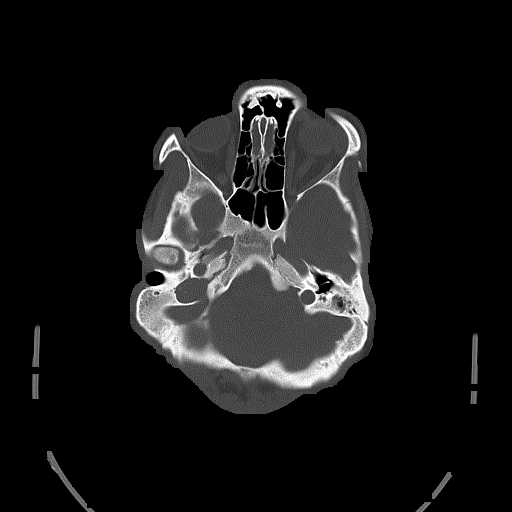
[im 16/76  bone]
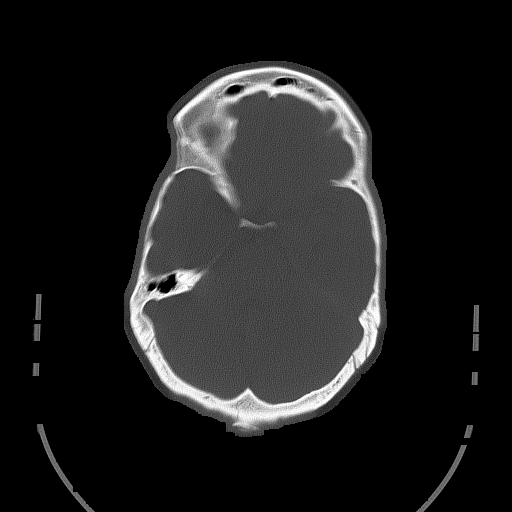

[Series 4: head without cor · coronal · non-contrast · 0.29mm/px · 3 of 70 slices shown]
[im 24/70  brain]
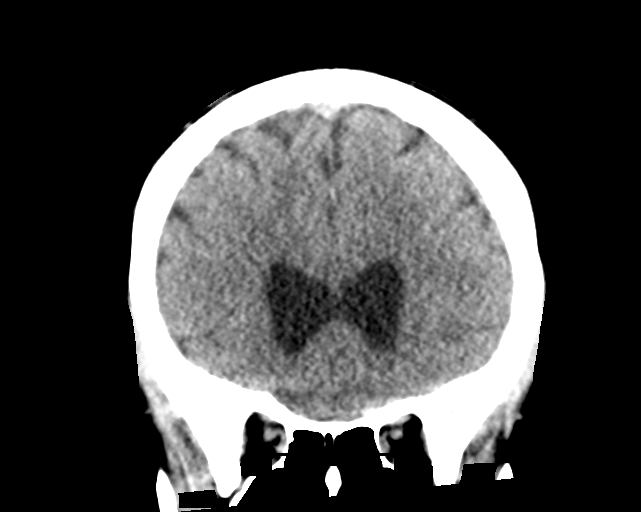
[im 31/70  brain]
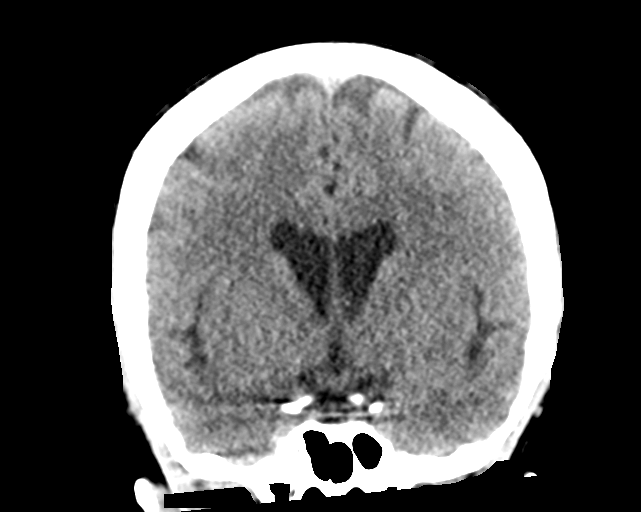
[im 39/70  brain]
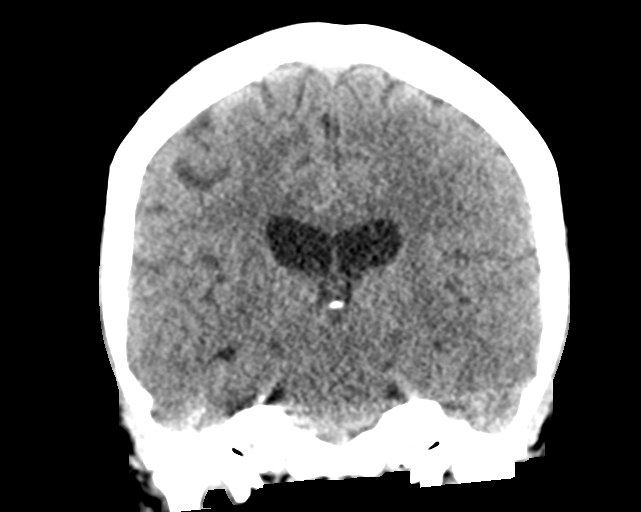

[Series 5: head without sag · sagittal · non-contrast · 0.29mm/px · 3 of 67 slices shown]
[im 23/67  brain]
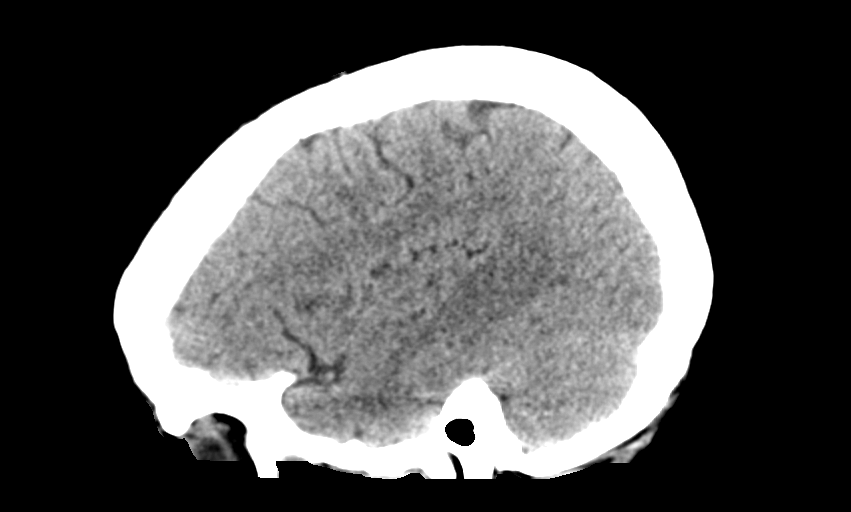
[im 34/67  brain]
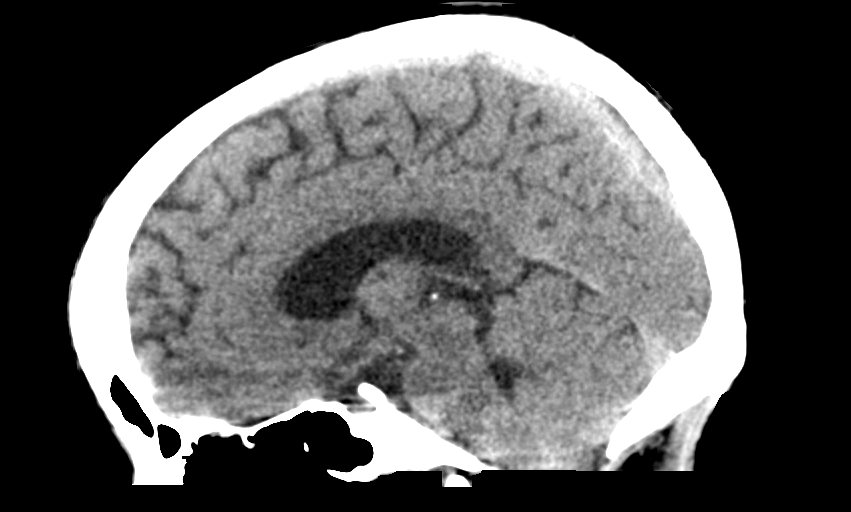
[im 45/67  brain]
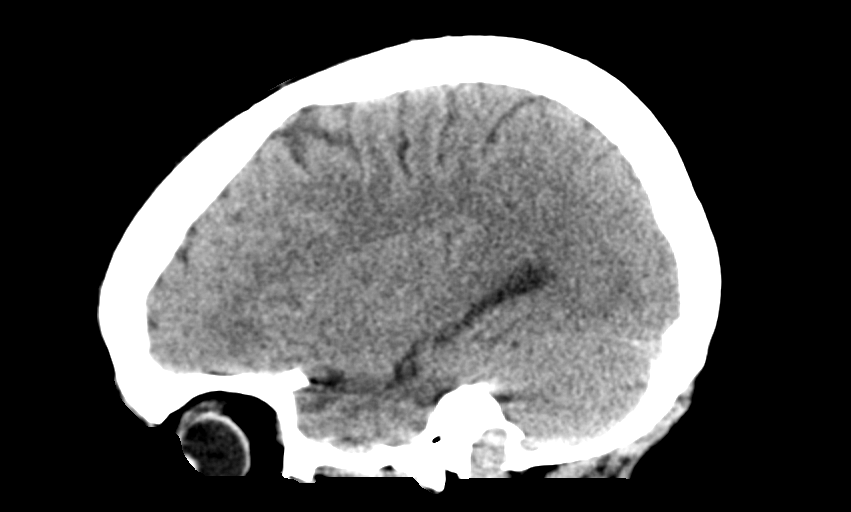

[15 of 47 positions shown; findings below may reference images not displayed]

FINDINGS: Brain: There is no acute intracranial hemorrhage, mass effect, or
edema. Gray-white differentiation is preserved. There is no
extra-axial fluid collection. Ventricles and sulci are similar in
size and configuration. Cerebellar tonsils are low lying. There is
mild crowding at the foramen magnum.

Vascular: No hyperdense vessel or unexpected calcification.

Skull: Calvarium is unremarkable apart from decompression at the
foramen magnum.

Sinuses/Orbits: No acute finding.

Other: None.
IMPRESSION: No acute intracranial abnormality.

Post decompression for Chiari 1 malformation.

## 2021-07-11 MED ORDER — LACTATED RINGERS IV BOLUS
1000.0000 mL | Freq: Once | INTRAVENOUS | Status: AC
  Administered 2021-07-11: 1000 mL via INTRAVENOUS

## 2021-07-11 MED ORDER — PROCHLORPERAZINE EDISYLATE 10 MG/2ML IJ SOLN
10.0000 mg | Freq: Once | INTRAMUSCULAR | Status: AC
  Administered 2021-07-11: 10 mg via INTRAVENOUS
  Filled 2021-07-11: qty 2

## 2021-07-11 MED ORDER — KETOROLAC TROMETHAMINE 15 MG/ML IJ SOLN
15.0000 mg | Freq: Once | INTRAMUSCULAR | Status: DC
  Filled 2021-07-11: qty 1

## 2021-07-11 MED ORDER — DIPHENHYDRAMINE HCL 50 MG/ML IJ SOLN
25.0000 mg | Freq: Once | INTRAMUSCULAR | Status: AC
  Administered 2021-07-11: 25 mg via INTRAVENOUS
  Filled 2021-07-11: qty 1

## 2021-07-11 NOTE — ED Provider Notes (Signed)
[facility] EMERGENCY DEPARTMENT Provider Note   CSN: [id number] Arrival date & time: 07/10/21  2311     History Chief Complaint  Patient presents with   Dizziness    Tamara Clark is a 25 y.o. female with PMH Chiari I status post C1 compression, asthma, migraines who presents the emergency department for evaluation of vertigo.  Patient states that over the last 5 days she has had persistent worsening vertigo.  She states that her vertigo worsens with lateral head movement and is fatigable.  She has not had associated headache, numbness, tingling, weakness, nausea, vomiting, diarrhea or other systemic symptoms.  Denies fever.   Dizziness Associated symptoms: no chest pain, no palpitations, no shortness of breath and no vomiting       Past Medical History:  Diagnosis Date   Arnold-Chiari malformation, type I ([place])    Asthma     Patient Active Problem List   Diagnosis Date Noted   Chiari I malformation ([place]) 03/27/2020    Past Surgical History:  Procedure Laterality Date   NO PAST SURGERIES     SUBOCCIPITAL CRANIECTOMY CERVICAL LAMINECTOMY N/A 03/27/2020   Procedure: Chiari decompression;  Surgeon: Kristeen Miss, MD;  Location: [facility];  Service: Neurosurgery;  Laterality: N/A;  posterior     OB History   No obstetric history on file.     Family History  Adopted: Yes  Problem Relation Age of Onset   Healthy Mother    Healthy Father     Social History   Tobacco Use   Smoking status: Never   Smokeless tobacco: Never  Vaping Use   Vaping Use: Never used  Substance Use Topics   Alcohol use: Yes    Comment: socially   Drug use: Never    Home Medications Prior to Admission medications   Medication Sig Start Date End Date Taking? Authorizing Provider  albuterol (VENTOLIN HFA) 108 (90 Base) MCG/ACT inhaler Inhale 1-2 puffs into the lungs every 4 (four) hours as needed for wheezing or shortness of breath.     [provider]   dexamethasone (DECADRON) 1 MG tablet Take 1 tablet (1 mg total) by mouth See admin instructions. 03/29/20   Osie Cheeks, NP  diphenhydrAMINE (BENADRYL) 25 MG tablet Take 25 mg by mouth at bedtime.    [provider]  DULoxetine (CYMBALTA) 30 MG capsule Take 30 mg by mouth at bedtime.  03/08/20   [provider]  eletriptan (RELPAX) 40 MG tablet Take 40 mg by mouth every 2 (two) hours as needed for migraine.  04/08/19   [provider]  LO LOESTRIN FE 1 MG-10 MCG / 10 MCG tablet Take 1 tablet by mouth at bedtime.  01/20/20   [provider]  methocarbamol (ROBAXIN) 500 MG tablet Take 1 tablet (500 mg total) by mouth every 6 (six) hours as needed for muscle spasms. 03/29/20   Osie Cheeks, NP  omeprazole (PRILOSEC) 40 MG capsule Take 40 mg by mouth at bedtime.  01/09/20   [provider]  promethazine (PHENERGAN) 12.5 MG tablet Take 1 tablet (12.5 mg total) by mouth every 6 (six) hours as needed for nausea or vomiting. 03/29/20   Osie Cheeks, NP  topiramate (TOPAMAX) 50 MG tablet Take 50 mg by mouth 2 (two) times daily. 03/05/20   [provider]  Vibegron (GEMTESA) 75 MG TABS Take 75 mg by mouth daily.    [provider]    Allergies    Venlafaxine, Doxycycline, Gabapentin, Ondansetron  hcl, Chlorhexidine, Epinephrine, and Epitol [carbamazepine]  Review of Systems   Review of Systems  Constitutional:  Negative for chills and fever.  HENT:  Negative for ear pain and sore throat.   Eyes:  Negative for pain and visual disturbance.  Respiratory:  Negative for cough and shortness of breath.   Cardiovascular:  Negative for chest pain and palpitations.  Gastrointestinal:  Negative for abdominal pain and vomiting.  Genitourinary:  Negative for dysuria and hematuria.  Musculoskeletal:  Negative for arthralgias and back pain.  Skin:  Negative for color change and rash.  Neurological:  Positive for dizziness. Negative for seizures and syncope.  All other  systems reviewed and are negative.  Physical Exam Updated Vital Signs BP 98/68    Pulse 90    Temp 98.3 F (36.8 C) (Oral)    Resp 20    SpO2 99%   Physical Exam  ED Results / Procedures / Treatments   Labs (all labs ordered are listed, but only abnormal results are displayed) Labs Reviewed - No data to display  EKG None  Radiology CT Head Wo Contrast  Result Date: 07/11/2021 CLINICAL DATA:  Vertigo, central EXAM: CT HEAD WITHOUT CONTRAST TECHNIQUE: Contiguous axial images were obtained from the base of the skull through the vertex without intravenous contrast. COMPARISON:  Correlation made with prior MRI FINDINGS: Brain: There is no acute intracranial hemorrhage, mass effect, or edema. Gray-white differentiation is preserved. There is no extra-axial fluid collection. Ventricles and sulci are similar in size and configuration. Cerebellar tonsils are low lying. There is mild crowding at the foramen magnum. Vascular: No hyperdense vessel or unexpected calcification. Skull: Calvarium is unremarkable apart from decompression at the foramen magnum. Sinuses/Orbits: No acute finding. Other: None. IMPRESSION: No acute intracranial abnormality. Post decompression for Chiari 1 malformation. Electronically Signed   By: Guadlupe Spanish M.D.   On: 07/11/2021 08:51    Procedures Procedures   Medications Ordered in ED Medications  meclizine (ANTIVERT) tablet 25 mg (25 mg Oral Given 07/11/21 0822)  prochlorperazine (COMPAZINE) injection 10 mg (10 mg Intravenous Given 07/11/21 0822)  diphenhydrAMINE (BENADRYL) injection 25 mg (25 mg Intravenous Given 07/11/21 0823)  lactated ringers bolus 1,000 mL (1,000 mLs Intravenous New Bag/Given 07/11/21 0827)    ED Course  I have reviewed the triage vital signs and the nursing notes.  Pertinent labs & imaging results that were available during my care of the patient were reviewed by me and considered in my medical decision making (see chart for details).     MDM Rules/Calculators/A&P                           Patient seen the emergency department for evaluation of vertigo.  Physical exam reveals no nystagmus on Dix-Hallpike, no focal motor or sensory deficits, otherwise unremarkable.  CT head unremarkable.  Patient received a headache cocktail here in the emergency department which led to complete resolution of her symptoms.  I have lower suspicion for benign positional paroxysmal vertigo given the patient's physical exam, and have higher suspicion for migraine with aura as the patient's previous migraine history has included vertigo similar to her presentation today.  In addition, the patient's resolution with a migraine cocktail speaks to an increased likelihood of this diagnosis.  I have very low suspicion for central stroke at this time as her symptoms were fatigable and improved with medication here in the emergency department.  At this time she is safe for  discharge and she was given strict return precautions which she voiced understanding.  Patient discharged. Final Clinical Impression(s) / ED Diagnoses Final diagnoses:  Dizziness  Vertigo    Rx / DC Orders ED Discharge Orders     None        Dhanya Bogle, Debe Coder, MD 07/11/21 [phone]

## 2021-08-02 ENCOUNTER — Ambulatory Visit (INDEPENDENT_AMBULATORY_CARE_PROVIDER_SITE_OTHER): Payer: Self-pay | Admitting: Allergy

## 2021-08-02 ENCOUNTER — Encounter: Payer: Self-pay | Admitting: Allergy

## 2021-08-02 ENCOUNTER — Other Ambulatory Visit: Payer: Self-pay

## 2021-08-02 VITALS — BP 116/70 | HR 108 | Temp 98.3°F | Resp 18 | Ht 60.0 in | Wt 114.4 lb

## 2021-08-02 DIAGNOSIS — L508 Other urticaria: Secondary | ICD-10-CM

## 2021-08-02 DIAGNOSIS — L2489 Irritant contact dermatitis due to other agents: Secondary | ICD-10-CM

## 2021-08-02 DIAGNOSIS — T781XXD Other adverse food reactions, not elsewhere classified, subsequent encounter: Secondary | ICD-10-CM

## 2021-08-02 DIAGNOSIS — J310 Chronic rhinitis: Secondary | ICD-10-CM

## 2021-08-02 NOTE — Progress Notes (Signed)
New Patient Note  RE: Tamara Clark MRN: [id number] DOB: January 31, 1996 Date of Office Visit: 08/02/2021  Referring provider: Tamala Bari, DO Primary care provider: Lilian Coma., MD  Chief Complaint: hives and allergies  History of present illness: Tamara Clark is a 26 y.o. female presenting today for consultation for urticaria and allergic rhinitis.  She has been having hives for years.  She reports she has sensitive skin as well.   She reports hives appears couple times a week that is usually on her face and arms.  She reports last episode was around Tuesday/Wednesday of this week.  The hives are sometimes itchy.  The hives she describes as small red bumps on her face and less apparent on her arms.  She does report facial swelling with the rash.  The hives last several hours before resolving. Does not leave any bruising marks.  No fevers.  She does have fibromyalgia thus she has pains in general.   She will usually take benadryl when the hives occur.  Denies any preceding illnesses before hive appearance.  No change in detergents, lotions, soaps or body products.  Denies stings or bites.  Denies changes in her diet and medications.  She states she is allergic to peppers (causes swelling and throat closure), lemongrass (causes itching espeically of eyes, mouth) and medications.   She reports sneezing and can have itchy eyes (with lemongrass).  Tried zyrtec for sneezing and does feel it was helpful.   She states her PCP is managing her asthma and this is not a concern for her at this time.  She states some metals cause rash thus she knows to only wear gold or sterling silver jewelry.  She also states she has had to change detergents and soaps/body products often due to skin irritation.   She states she has seen an allergist before through the [facility].  She thinks this was about a year ago.  She states she has had environmental allergy testing multiple times that has been  negative.   Review of systems: Review of Systems  Constitutional: Negative.   HENT:  Positive for sneezing.   Eyes: Negative.   Respiratory: Negative.    Cardiovascular: Negative.   Gastrointestinal: Negative.   Musculoskeletal: Negative.   Skin:  Positive for rash.  Allergic/Immunologic: Negative.   Neurological: Negative.    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  2 dogs and 1 cat in the home.  There is concern for water damage or mildew in the home.  There is no concern for roaches in the home. She is an Manufacturing engineer.  She has no smoking history.   Medication List: Current Outpatient Medications  Medication Sig Dispense Refill   albuterol (VENTOLIN HFA) 108 (90 Base) MCG/ACT inhaler Inhale 1-2  puffs into the lungs every 4 (four) hours as needed for wheezing or shortness of breath.      dexamethasone (DECADRON) 1 MG tablet Take 1 tablet (1 mg total) by mouth See admin instructions. 15 tablet 0   diphenhydrAMINE (BENADRYL) 25 MG tablet Take 25 mg by mouth at bedtime.     DULoxetine (CYMBALTA) 30 MG capsule Take 30 mg by mouth at bedtime.      eletriptan (RELPAX) 40 MG tablet Take 40 mg by mouth every 2 (two) hours as needed for migraine.      LO LOESTRIN FE 1 MG-10 MCG / 10 MCG tablet Take 1 tablet by mouth at bedtime.       methocarbamol (ROBAXIN) 500 MG tablet Take 1 tablet (500 mg total) by mouth every 6 (six) hours as needed for muscle spasms. 60 tablet 0   omeprazole (PRILOSEC) 40 MG capsule Take 40 mg by mouth at bedtime.      promethazine (PHENERGAN) 12.5 MG tablet Take 1 tablet (12.5 mg total) by mouth every 6 (six) hours as needed for nausea or vomiting. 15 tablet 0   topiramate (TOPAMAX) 50 MG tablet Take 50 mg by mouth 2 (two) times daily.     Vibegron (GEMTESA) 75 MG TABS Take 75 mg by mouth daily.     No current facility-administered medications for this visit.    Known medication allergies: Allergies  Allergen Reactions   Oxycodone Anaphylaxis    Hypotension, syncope   Venlafaxine Anaphylaxis   Doxycycline Hives   Gabapentin Hives and Swelling   Ondansetron Hcl Hives   Chlorhexidine     rash   Epinephrine Hives and Other (See Comments)    Heart racing     Physical examination: Blood pressure 116/70, pulse (!) 108, temperature 98.3 F (36.8 C), resp. rate 18, height 5' (1.524 m), weight 114 lb 6.4 oz (51.9 kg), SpO2 99 %.  General: Alert, interactive, in no acute distress. HEENT: PERRLA, TMs pearly gray, turbinates non-edematous without discharge, post-pharynx non erythematous. Neck: Supple without lymphadenopathy. Lungs: Clear to auscultation without wheezing, rhonchi or rales. {no increased work of breathing. CV: Normal S1, S2 without murmurs. Abdomen: Nondistended, nontender. Skin: Warm and dry, without lesions or rashes. Extremities:  No clubbing, cyanosis or edema. Neuro:   Grossly intact.  Diagnositics/Labs: None today  Assessment and plan: Hives, chronic  - at this time etiology of hives and swelling is unknown.  Hives can be caused by a variety of different triggers including illness/infection, foods, medications, stings, exercise, pressure, vibrations, extremes of temperature to name a few however majority of the time there is no identifiable trigger.  Your symptoms  have been ongoing for >6 weeks making this chronic thus will obtain labwork to evaluate: CBC w diff, CMP, tryptase, hive panel, environmental panel, alpha-gal panel  - for hive control recommend following antihistamine regimen: Zyrtec 10mg  daily with Pepcid 20mg  daily.  If still having hives on daily dosing then increase to 1 tab twice a day of both medications.  If double therapy regimen is not effective enough then would recommend adding Singulair to regimen.  If triple therapy regimen is not effective then would proceed with Xolair monthly injections which we will discuss at future visit if needed  Allergies -Zyrtec as above for general allergy symptom control -for itchy/watery eyes can use Pataday 1 drop each eye daily as needed  Food allergy - will obtain IgE levels for peppers.  There is not an available test for lemongrass - continue  avoidance of peppers and lemongrass - have access to self-injectable epinephrine (Epipen or AuviQ) 0.3mg  at all times - follow emergency action plan in case of allergic reaction  Contact dermatitis - it appears you have contact allergy to certain products - patch testing can be done to help determine products you should avoid in things.  Patch testing involves applying patch panels to the back and leaving them in place for several days.  Patch testing is best done on Monday with return to office on Wednesday and Friday of same week for removal and readings.  You do not have to stop antihistamines for patch testing.  If interested in this test option you can schedule for patch testing when convenient for you.   Follow-up in 3 months or sooner if needed   I appreciate the opportunity to take part in Ethne's care. Please do not hesitate to contact me with questions.  Sincerely,   Prudy Feeler, MD Allergy/Immunology [facility]

## 2021-08-02 NOTE — Patient Instructions (Addendum)
Hives, chronic  - at this time etiology of hives and swelling is unknown.  Hives can be caused by a variety of different triggers including illness/infection, foods, medications, stings, exercise, pressure, vibrations, extremes of temperature to name a few however majority of the time there is no identifiable trigger.  Your symptoms have been ongoing for >6 weeks making this chronic thus will obtain labwork to evaluate: CBC w diff, CMP, tryptase, hive panel, environmental panel, alpha-gal panel  - for hive control recommend following antihistamine regimen: Zyrtec 10mg  daily with Pepcid 20mg  daily.  If still having hives on daily dosing then increase to 1 tab twice a day of both medications.  If double therapy regimen is not effective enough then would recommend adding Singulair to regimen.  If triple therapy regimen is not effective then would proceed with Xolair monthly injections which we will discuss at future visit if needed  Allergies -Zyrtec as above for general allergy symptom control -for itchy/watery eyes can use Pataday 1 drop each eye daily as needed  Food allergy - will obtain IgE levels for peppers.  There is not an available test for lemongrass - continue avoidance of peppers and lemongrass - have access to self-injectable epinephrine (Epipen or AuviQ) 0.3mg  at all times - follow emergency action plan in case of allergic reaction  Contact dermatitis - it appears you have contact allergy to certain products - patch testing can be done to help determine products you should avoid in things.  Patch testing involves applying patch panels to the back and leaving them in place for several days.  Patch testing is best done on Monday with return to office on Wednesday and Friday of same week for removal and readings.  You do not have to stop antihistamines for patch testing.  If interested in this test option you can schedule for patch testing when convenient for you.   Follow-up in 3 months  or sooner if needed     True Test looks for the following sensitivities:

## 2021-08-14 LAB — ALLERGENS W/TOTAL IGE AREA 2

## 2021-08-14 LAB — COMPREHENSIVE METABOLIC PANEL
ALT: 17 IU/L (ref 0–32)
AST: 22 IU/L (ref 0–40)
Albumin/Globulin Ratio: 1.7 (ref 1.2–2.2)
Albumin: 4.8 g/dL (ref 3.9–5.0)
Alkaline Phosphatase: 72 IU/L (ref 44–121)
BUN/Creatinine Ratio: 18 (ref 9–23)
BUN: 14 mg/dL (ref 6–20)
Bilirubin Total: 0.3 mg/dL (ref 0.0–1.2)
CO2: 20 mmol/L (ref 20–29)
Calcium: 9.9 mg/dL (ref 8.7–10.2)
Chloride: 105 mmol/L (ref 96–106)
Creatinine, Ser: 0.79 mg/dL (ref 0.57–1.00)
Globulin, Total: 2.9 g/dL (ref 1.5–4.5)
Glucose: 85 mg/dL (ref 70–99)
Potassium: 4.2 mmol/L (ref 3.5–5.2)
Sodium: 142 mmol/L (ref 134–144)
Total Protein: 7.7 g/dL (ref 6.0–8.5)
eGFR: 107 mL/min/{1.73_m2} (ref >59)

## 2021-08-14 LAB — CBC WITH DIFFERENTIAL/PLATELET
Basophils Absolute: 0 10*3/uL (ref 0.0–0.2)
Basos: 0 %
EOS (ABSOLUTE): 0.1 10*3/uL (ref 0.0–0.4)
Eos: 1 %
Hematocrit: 40.8 % (ref 34.0–46.6)
Hemoglobin: 13.1 g/dL (ref 11.1–15.9)
Immature Grans (Abs): 0 10*3/uL (ref 0.0–0.1)
Immature Granulocytes: 0 %
Lymphocytes Absolute: 1.5 10*3/uL (ref 0.7–3.1)
Lymphs: 26 %
MCH: 24.7 pg — ABNORMAL LOW (ref 26.6–33.0)
MCHC: 32.1 g/dL (ref 31.5–35.7)
MCV: 77 fL — ABNORMAL LOW (ref 79–97)
Monocytes Absolute: 0.4 10*3/uL (ref 0.1–0.9)
Monocytes: 8 %
Neutrophils Absolute: 3.7 10*3/uL (ref 1.4–7.0)
Neutrophils: 65 %
Platelets: 342 10*3/uL (ref 150–450)
RBC: 5.3 x10E6/uL — ABNORMAL HIGH (ref 3.77–5.28)
RDW: 15 % (ref 11.7–15.4)
WBC: 5.7 10*3/uL (ref 3.4–10.8)

## 2021-08-14 LAB — ALPHA-GAL PANEL
Allergen Lamb IgE: <0.1 kU/L
Beef IgE: <0.1 kU/L
IgE (Immunoglobulin E), Serum: 139 IU/mL (ref 6–495)
O215-IgE Alpha-Gal: <0.1 kU/L
Pork IgE: <0.1 kU/L

## 2021-08-14 LAB — CHRONIC URTICARIA: cu index: <2.8 (ref <10)

## 2021-08-14 LAB — THYROID ANTIBODIES
Thyroglobulin Antibody: 6.4 IU/mL — ABNORMAL HIGH (ref 0.0–0.9)
Thyroperoxidase Ab SerPl-aCnc: 188 IU/mL — ABNORMAL HIGH (ref 0–34)

## 2021-08-14 LAB — JALAPENO PEPPER, IGE
Class Interpretation: 0
Jalapeno Pepper: <0.35 kU/L (ref <0.35)

## 2021-08-14 LAB — TSH+FREE T4
Free T4: 1.3 ng/dL (ref 0.82–1.77)
TSH: 1.35 u[IU]/mL (ref 0.450–4.500)

## 2021-08-14 LAB — ALLERGEN,CHILI PEPPER,RF279: F279-IgE Chili Pepper: <0.1 kU/L

## 2021-08-14 LAB — TRYPTASE: Tryptase: 3 ug/L (ref 2.2–13.2)

## 2021-08-14 LAB — SEDIMENTATION RATE: Sed Rate: 10 mm/hr (ref 0–32)

## 2021-08-14 LAB — ALLERGEN,GRN PEPPER,PAPRIKA,F218: Paprika IgE: <0.1 kU/L

## 2021-11-08 ENCOUNTER — Ambulatory Visit: Payer: Self-pay | Admitting: Allergy

## 2021-11-08 DIAGNOSIS — J309 Allergic rhinitis, unspecified: Secondary | ICD-10-CM
